# Patient Record
Sex: Female | Born: 1980 | Race: White | Hispanic: No | Marital: Single | Smoking: Current every day smoker
Health system: Southern US, Community
[De-identification: ages and names within clinical notes are randomized; demographics above are authoritative.]

## PROBLEM LIST (undated history)

## (undated) ENCOUNTER — Inpatient Hospital Stay (HOSPITAL_COMMUNITY): Payer: Self-pay

## (undated) DIAGNOSIS — F329 Major depressive disorder, single episode, unspecified: Secondary | ICD-10-CM

## (undated) DIAGNOSIS — F32A Depression, unspecified: Secondary | ICD-10-CM

## (undated) DIAGNOSIS — Z789 Other specified health status: Secondary | ICD-10-CM

## (undated) HISTORY — PX: SALPINGECTOMY: SHX328

---
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
## 2001-07-09 ENCOUNTER — Emergency Department (HOSPITAL_COMMUNITY): Admission: EM | Admit: 2001-07-09 | Discharge: 2001-07-09 | Payer: Self-pay | Admitting: Emergency Medicine

## 2001-07-11 ENCOUNTER — Emergency Department (HOSPITAL_COMMUNITY): Admission: EM | Admit: 2001-07-11 | Discharge: 2001-07-11 | Payer: Self-pay | Admitting: Emergency Medicine

## 2001-08-03 ENCOUNTER — Emergency Department (HOSPITAL_COMMUNITY): Admission: EM | Admit: 2001-08-03 | Discharge: 2001-08-04 | Payer: Self-pay | Admitting: Emergency Medicine

## 2001-08-04 ENCOUNTER — Encounter: Payer: Self-pay | Admitting: Emergency Medicine

## 2001-08-19 ENCOUNTER — Inpatient Hospital Stay (HOSPITAL_COMMUNITY): Admission: AD | Admit: 2001-08-19 | Discharge: 2001-08-19 | Payer: Self-pay | Admitting: Obstetrics

## 2001-12-06 ENCOUNTER — Inpatient Hospital Stay (HOSPITAL_COMMUNITY): Admission: AD | Admit: 2001-12-06 | Discharge: 2001-12-06 | Payer: Self-pay | Admitting: *Deleted

## 2001-12-07 ENCOUNTER — Emergency Department (HOSPITAL_COMMUNITY): Admission: EM | Admit: 2001-12-07 | Discharge: 2001-12-07 | Payer: Self-pay | Admitting: Emergency Medicine

## 2002-02-11 ENCOUNTER — Ambulatory Visit (HOSPITAL_COMMUNITY): Admission: RE | Admit: 2002-02-11 | Discharge: 2002-02-11 | Payer: Self-pay | Admitting: *Deleted

## 2002-03-13 ENCOUNTER — Inpatient Hospital Stay (HOSPITAL_COMMUNITY): Admission: AD | Admit: 2002-03-13 | Discharge: 2002-03-13 | Payer: Self-pay | Admitting: *Deleted

## 2002-04-12 ENCOUNTER — Encounter (INDEPENDENT_AMBULATORY_CARE_PROVIDER_SITE_OTHER): Payer: Self-pay | Admitting: Specialist

## 2002-04-12 ENCOUNTER — Inpatient Hospital Stay (HOSPITAL_COMMUNITY): Admission: AD | Admit: 2002-04-12 | Discharge: 2002-04-16 | Payer: Self-pay | Admitting: Obstetrics and Gynecology

## 2003-11-15 ENCOUNTER — Emergency Department (HOSPITAL_COMMUNITY): Admission: EM | Admit: 2003-11-15 | Discharge: 2003-11-16 | Payer: Self-pay | Admitting: Emergency Medicine

## 2004-03-19 ENCOUNTER — Emergency Department (HOSPITAL_COMMUNITY): Admission: EM | Admit: 2004-03-19 | Discharge: 2004-03-19 | Payer: Self-pay | Admitting: Emergency Medicine

## 2004-05-25 ENCOUNTER — Emergency Department (HOSPITAL_COMMUNITY): Admission: EM | Admit: 2004-05-25 | Discharge: 2004-05-25 | Payer: Self-pay | Admitting: Emergency Medicine

## 2004-05-28 ENCOUNTER — Emergency Department (HOSPITAL_COMMUNITY): Admission: EM | Admit: 2004-05-28 | Discharge: 2004-05-28 | Payer: Self-pay | Admitting: Emergency Medicine

## 2004-06-12 ENCOUNTER — Emergency Department (HOSPITAL_COMMUNITY): Admission: EM | Admit: 2004-06-12 | Discharge: 2004-06-12 | Payer: Self-pay | Admitting: Emergency Medicine

## 2004-06-14 ENCOUNTER — Inpatient Hospital Stay (HOSPITAL_COMMUNITY): Admission: AD | Admit: 2004-06-14 | Discharge: 2004-06-14 | Payer: Self-pay | Admitting: Obstetrics and Gynecology

## 2004-07-20 ENCOUNTER — Emergency Department (HOSPITAL_COMMUNITY): Admission: EM | Admit: 2004-07-20 | Discharge: 2004-07-20 | Payer: Self-pay | Admitting: Emergency Medicine

## 2004-08-15 ENCOUNTER — Emergency Department (HOSPITAL_COMMUNITY): Admission: EM | Admit: 2004-08-15 | Discharge: 2004-08-15 | Payer: Self-pay | Admitting: Emergency Medicine

## 2004-12-28 ENCOUNTER — Emergency Department (HOSPITAL_COMMUNITY): Admission: EM | Admit: 2004-12-28 | Discharge: 2004-12-28 | Payer: Self-pay | Admitting: Emergency Medicine

## 2005-01-19 ENCOUNTER — Emergency Department (HOSPITAL_COMMUNITY): Admission: EM | Admit: 2005-01-19 | Discharge: 2005-01-19 | Payer: Self-pay | Admitting: Emergency Medicine

## 2005-01-24 ENCOUNTER — Emergency Department (HOSPITAL_COMMUNITY): Admission: EM | Admit: 2005-01-24 | Discharge: 2005-01-24 | Payer: Self-pay | Admitting: Emergency Medicine

## 2005-07-17 ENCOUNTER — Emergency Department (HOSPITAL_COMMUNITY): Admission: EM | Admit: 2005-07-17 | Discharge: 2005-07-17 | Payer: Self-pay | Admitting: *Deleted

## 2005-07-28 ENCOUNTER — Emergency Department (HOSPITAL_COMMUNITY): Admission: EM | Admit: 2005-07-28 | Discharge: 2005-07-29 | Payer: Self-pay | Admitting: Emergency Medicine

## 2005-08-05 ENCOUNTER — Emergency Department (HOSPITAL_COMMUNITY): Admission: EM | Admit: 2005-08-05 | Discharge: 2005-08-05 | Payer: Self-pay | Admitting: Emergency Medicine

## 2005-09-24 ENCOUNTER — Inpatient Hospital Stay (HOSPITAL_COMMUNITY): Admission: AD | Admit: 2005-09-24 | Discharge: 2005-09-26 | Payer: Self-pay | Admitting: Family Medicine

## 2005-09-25 ENCOUNTER — Encounter (INDEPENDENT_AMBULATORY_CARE_PROVIDER_SITE_OTHER): Payer: Self-pay | Admitting: Specialist

## 2005-10-29 HISTORY — PX: SALPINGECTOMY: SHX328

## 2006-01-27 ENCOUNTER — Emergency Department (HOSPITAL_COMMUNITY): Admission: EM | Admit: 2006-01-27 | Discharge: 2006-01-27 | Payer: Self-pay | Admitting: Emergency Medicine

## 2006-01-28 ENCOUNTER — Inpatient Hospital Stay (HOSPITAL_COMMUNITY): Admission: AD | Admit: 2006-01-28 | Discharge: 2006-01-28 | Payer: Self-pay | Admitting: *Deleted

## 2006-03-11 ENCOUNTER — Emergency Department (HOSPITAL_COMMUNITY): Admission: EM | Admit: 2006-03-11 | Discharge: 2006-03-11 | Payer: Self-pay | Admitting: Emergency Medicine

## 2006-03-24 ENCOUNTER — Emergency Department (HOSPITAL_COMMUNITY): Admission: EM | Admit: 2006-03-24 | Discharge: 2006-03-24 | Payer: Self-pay | Admitting: Emergency Medicine

## 2006-06-14 ENCOUNTER — Emergency Department (HOSPITAL_COMMUNITY): Admission: EM | Admit: 2006-06-14 | Discharge: 2006-06-14 | Payer: Self-pay | Admitting: Emergency Medicine

## 2006-07-25 ENCOUNTER — Emergency Department (HOSPITAL_COMMUNITY): Admission: EM | Admit: 2006-07-25 | Discharge: 2006-07-26 | Payer: Self-pay | Admitting: *Deleted

## 2006-08-05 ENCOUNTER — Emergency Department (HOSPITAL_COMMUNITY): Admission: EM | Admit: 2006-08-05 | Discharge: 2006-08-05 | Payer: Self-pay | Admitting: Emergency Medicine

## 2006-10-17 ENCOUNTER — Emergency Department (HOSPITAL_COMMUNITY): Admission: EM | Admit: 2006-10-17 | Discharge: 2006-10-17 | Payer: Self-pay | Admitting: Emergency Medicine

## 2006-10-17 ENCOUNTER — Inpatient Hospital Stay (HOSPITAL_COMMUNITY): Admission: AD | Admit: 2006-10-17 | Discharge: 2006-10-17 | Payer: Self-pay | Admitting: Gynecology

## 2006-10-19 ENCOUNTER — Inpatient Hospital Stay (HOSPITAL_COMMUNITY): Admission: AD | Admit: 2006-10-19 | Discharge: 2006-10-19 | Payer: Self-pay | Admitting: Obstetrics & Gynecology

## 2006-10-21 ENCOUNTER — Inpatient Hospital Stay (HOSPITAL_COMMUNITY): Admission: AD | Admit: 2006-10-21 | Discharge: 2006-10-21 | Payer: Self-pay | Admitting: Obstetrics and Gynecology

## 2006-10-25 ENCOUNTER — Inpatient Hospital Stay (HOSPITAL_COMMUNITY): Admission: AD | Admit: 2006-10-25 | Discharge: 2006-10-25 | Payer: Self-pay | Admitting: Obstetrics & Gynecology

## 2006-11-08 ENCOUNTER — Inpatient Hospital Stay (HOSPITAL_COMMUNITY): Admission: RE | Admit: 2006-11-08 | Discharge: 2006-11-08 | Payer: Self-pay | Admitting: Obstetrics & Gynecology

## 2006-11-13 ENCOUNTER — Ambulatory Visit: Payer: Self-pay | Admitting: Obstetrics & Gynecology

## 2006-11-13 ENCOUNTER — Ambulatory Visit (HOSPITAL_COMMUNITY): Admission: AD | Admit: 2006-11-13 | Discharge: 2006-11-13 | Payer: Self-pay | Admitting: Obstetrics & Gynecology

## 2006-11-13 ENCOUNTER — Encounter (INDEPENDENT_AMBULATORY_CARE_PROVIDER_SITE_OTHER): Payer: Self-pay | Admitting: *Deleted

## 2007-03-02 ENCOUNTER — Inpatient Hospital Stay (HOSPITAL_COMMUNITY): Admission: AD | Admit: 2007-03-02 | Discharge: 2007-03-02 | Payer: Self-pay | Admitting: Obstetrics & Gynecology

## 2007-06-03 ENCOUNTER — Inpatient Hospital Stay (HOSPITAL_COMMUNITY): Admission: AD | Admit: 2007-06-03 | Discharge: 2007-06-04 | Payer: Self-pay | Admitting: Obstetrics and Gynecology

## 2007-06-16 ENCOUNTER — Inpatient Hospital Stay (HOSPITAL_COMMUNITY): Admission: AD | Admit: 2007-06-16 | Discharge: 2007-06-17 | Payer: Self-pay | Admitting: Obstetrics & Gynecology

## 2007-06-23 ENCOUNTER — Inpatient Hospital Stay (HOSPITAL_COMMUNITY): Admission: AD | Admit: 2007-06-23 | Discharge: 2007-06-23 | Payer: Self-pay | Admitting: Obstetrics & Gynecology

## 2007-07-14 ENCOUNTER — Emergency Department (HOSPITAL_COMMUNITY): Admission: EM | Admit: 2007-07-14 | Discharge: 2007-07-14 | Payer: Self-pay | Admitting: Emergency Medicine

## 2007-08-24 ENCOUNTER — Inpatient Hospital Stay (HOSPITAL_COMMUNITY): Admission: AD | Admit: 2007-08-24 | Discharge: 2007-08-24 | Payer: Self-pay | Admitting: Obstetrics & Gynecology

## 2007-08-25 ENCOUNTER — Inpatient Hospital Stay (HOSPITAL_COMMUNITY): Admission: AD | Admit: 2007-08-25 | Discharge: 2007-08-25 | Payer: Self-pay | Admitting: Obstetrics & Gynecology

## 2009-08-12 ENCOUNTER — Ambulatory Visit (HOSPITAL_COMMUNITY): Admission: RE | Admit: 2009-08-12 | Discharge: 2009-08-12 | Payer: Self-pay | Admitting: Obstetrics & Gynecology

## 2009-10-19 ENCOUNTER — Ambulatory Visit (HOSPITAL_COMMUNITY): Admission: RE | Admit: 2009-10-19 | Discharge: 2009-10-19 | Payer: Self-pay | Admitting: Family Medicine

## 2009-12-05 ENCOUNTER — Inpatient Hospital Stay (HOSPITAL_COMMUNITY): Admission: AD | Admit: 2009-12-05 | Discharge: 2009-12-05 | Payer: Self-pay | Admitting: Obstetrics & Gynecology

## 2009-12-16 ENCOUNTER — Inpatient Hospital Stay (HOSPITAL_COMMUNITY): Admission: AD | Admit: 2009-12-16 | Discharge: 2009-12-16 | Payer: Self-pay | Admitting: Obstetrics & Gynecology

## 2009-12-25 ENCOUNTER — Inpatient Hospital Stay (HOSPITAL_COMMUNITY): Admission: AD | Admit: 2009-12-25 | Discharge: 2009-12-25 | Payer: Self-pay | Admitting: Obstetrics and Gynecology

## 2010-01-01 ENCOUNTER — Ambulatory Visit: Payer: Self-pay | Admitting: Obstetrics & Gynecology

## 2010-01-01 ENCOUNTER — Encounter: Payer: Self-pay | Admitting: Obstetrics & Gynecology

## 2010-01-01 ENCOUNTER — Inpatient Hospital Stay (HOSPITAL_COMMUNITY): Admission: AD | Admit: 2010-01-01 | Discharge: 2010-01-04 | Payer: Self-pay | Admitting: Obstetrics & Gynecology

## 2010-01-09 ENCOUNTER — Ambulatory Visit: Payer: Self-pay | Admitting: Obstetrics & Gynecology

## 2010-01-16 ENCOUNTER — Ambulatory Visit: Payer: Self-pay | Admitting: Obstetrics and Gynecology

## 2010-01-20 ENCOUNTER — Inpatient Hospital Stay (HOSPITAL_COMMUNITY): Admission: AD | Admit: 2010-01-20 | Discharge: 2010-01-20 | Payer: Self-pay | Admitting: Obstetrics and Gynecology

## 2010-01-25 ENCOUNTER — Ambulatory Visit: Payer: Self-pay | Admitting: Obstetrics and Gynecology

## 2010-01-30 ENCOUNTER — Ambulatory Visit: Payer: Self-pay | Admitting: Family

## 2010-01-30 ENCOUNTER — Inpatient Hospital Stay (HOSPITAL_COMMUNITY): Admission: RE | Admit: 2010-01-30 | Discharge: 2010-01-30 | Payer: Self-pay | Admitting: Obstetrics & Gynecology

## 2010-02-05 ENCOUNTER — Emergency Department (HOSPITAL_COMMUNITY): Admission: EM | Admit: 2010-02-05 | Discharge: 2010-02-05 | Payer: Self-pay | Admitting: Emergency Medicine

## 2010-02-08 ENCOUNTER — Emergency Department (HOSPITAL_COMMUNITY): Admission: EM | Admit: 2010-02-08 | Discharge: 2010-02-08 | Payer: Self-pay | Admitting: Emergency Medicine

## 2010-02-11 ENCOUNTER — Emergency Department (HOSPITAL_COMMUNITY): Admission: EM | Admit: 2010-02-11 | Discharge: 2010-02-11 | Payer: Self-pay | Admitting: Family Medicine

## 2010-02-19 ENCOUNTER — Inpatient Hospital Stay (HOSPITAL_COMMUNITY): Admission: AD | Admit: 2010-02-19 | Discharge: 2010-02-19 | Payer: Self-pay | Admitting: Obstetrics & Gynecology

## 2010-05-07 ENCOUNTER — Emergency Department (HOSPITAL_COMMUNITY): Admission: EM | Admit: 2010-05-07 | Discharge: 2010-05-07 | Payer: Self-pay | Admitting: Emergency Medicine

## 2011-01-14 LAB — BASIC METABOLIC PANEL
BUN: 24 mg/dL — ABNORMAL HIGH (ref 6–23)
Chloride: 106 mEq/L (ref 96–112)
GFR calc Af Amer: >60 mL/min (ref >60)
GFR calc non Af Amer: >60 mL/min (ref >60)
Glucose, Bld: 98 mg/dL (ref 70–99)
Potassium: 3.9 mEq/L (ref 3.5–5.1)

## 2011-01-14 LAB — URINALYSIS, ROUTINE W REFLEX MICROSCOPIC
Glucose, UA: NEGATIVE mg/dL
Ketones, ur: NEGATIVE mg/dL
Nitrite: NEGATIVE
Protein, ur: NEGATIVE mg/dL
Specific Gravity, Urine: 1.023 (ref 1.005–1.030)
Urobilinogen, UA: 0.2 mg/dL (ref 0.0–1.0)
pH: 5.5 (ref 5.0–8.0)

## 2011-01-14 LAB — DIFFERENTIAL
Basophils Absolute: 0.1 10*3/uL (ref 0.0–0.1)
Basophils Relative: 1 % (ref 0–1)
Lymphocytes Relative: 30 % (ref 12–46)
Monocytes Absolute: 0.7 10*3/uL (ref 0.1–1.0)
Neutro Abs: 6.2 10*3/uL (ref 1.7–7.7)

## 2011-01-14 LAB — CBC
MCV: 89.4 fL (ref 78.0–100.0)
Platelets: 216 10*3/uL (ref 150–400)
RDW: 12.6 % (ref 11.5–15.5)

## 2011-01-14 LAB — POCT PREGNANCY, URINE: Preg Test, Ur: NEGATIVE

## 2011-01-14 LAB — URINE MICROSCOPIC-ADD ON

## 2011-01-16 LAB — RAPID URINE DRUG SCREEN, HOSP PERFORMED: Cocaine: NOT DETECTED

## 2011-01-21 LAB — RAPID URINE DRUG SCREEN, HOSP PERFORMED
Barbiturates: POSITIVE — AB
Benzodiazepines: NOT DETECTED
Cocaine: NOT DETECTED
Opiates: NOT DETECTED
Tetrahydrocannabinol: NOT DETECTED

## 2011-01-21 LAB — CROSSMATCH
ABO/RH(D): A POS
Antibody Screen: NEGATIVE

## 2011-01-21 LAB — CBC
Hemoglobin: 11.9 g/dL — ABNORMAL LOW (ref 12.0–15.0)
MCV: 91.9 fL (ref 78.0–100.0)
RDW: 15.4 % (ref 11.5–15.5)
WBC: 15 10*3/uL — ABNORMAL HIGH (ref 4.0–10.5)

## 2011-03-16 NOTE — Discharge Summary (Signed)
[name], [name]            ACCOUNT NO.:  [id number]   MEDICAL RECORD NO.:  [id number]          PATIENT TYPE:  INP   LOCATION:  9317                          [facility]   PHYSICIAN:  Osborn Coho, M.D.   DATE OF BIRTH:  Nov 15, 1980   DATE OF ADMISSION:  09/24/2005  DATE OF DISCHARGE:  09/26/2005                                 DISCHARGE SUMMARY   DISCHARGE DIAGNOSIS:  Left ectopic pregnancy.   OPERATIONS:  On hospital day #1, the patient underwent a laparoscopic left  salpingectomy, and tolerated the procedure well. The patient was found to  have a leaking left ectopic which was removed, as described.   HISTORY OF PRESENT ILLNESS:  [name] is a 30 year old female who  presented to [facility] maternity admissions unit  complaining of left lower quadrant pain of several hours' duration. She was  found to have a positive urine pregnancy test with a beta HCG of 620 and a  pelvic ultrasound which revealed a 4 x 3 cm left mass with moderate to large  free fluid consistent with a ruptured ectopic. The patient's blood type was  A positive. Please see the patient's hospital chart for details.   ADMISSION PHYSICAL EXAMINATION:  VITAL SIGNS:  Blood pressure 114/64, pulse  is 107, respirations 20, temperature 98.5 degrees Fahrenheit orally.  GENERAL EXAM:  Within normal limits.  PELVIC EXAM:  External genitalia within normal limits. Vagina revealed  menstrual blood. Cervix was closed. Uterus was upper limits of normal size.  Adnexa was tender, left greater than right.   HOSPITAL COURSE:  On hospital day #1, the patient underwent a left  salpingectomy for a ruptured ectopic pregnancy, tolerating procedure well..  The patient's postoperative course was unremarkable with the patient having  received the maximum benefit of her hospital stay by postoperative day #1,  and, therefore, deemed ready for discharge home. The patient's discharge  hemoglobin was 11.6  (admission hemoglobin 13.4).   DISCHARGE MEDICATIONS:  Percocet 1-2 tablets every 4 hours as needed for  pain.   FOLLOW UP:  The patient is to follow up with Dr. Su Hilt on October 11, 2005 at 2:15 p.m.   DISCHARGE INSTRUCTIONS:  The patient was advised to avoid driving as long as  she is taking Percocet during the day.  She was further told that she may  shower, and may walk up steps. She should increase her activity slowly and  avoid sexual intercourse for 2 weeks. The patient's diet is without  restriction.   FINAL PATHOLOGY:  Left fallopian tube and ectopic:  Fallopian tube with  luminal chorionic villae consistent with tubal ectopic pregnancy.      [name].      Osborn Coho, M.D.  Electronically Signed    EJP/MEDQ  D:  10/13/2005  T:  10/14/2005  Job:  [id number]

## 2011-03-16 NOTE — Op Note (Signed)
NAMEDAISHIA, Kathryn Goodwin            ACCOUNT NO.:  [id number]   MEDICAL RECORD NO.:  [id number]          PATIENT TYPE:  INP   LOCATION:  9317                          [facility]   PHYSICIAN:  Osborn Coho, M.D.   DATE OF BIRTH:  [birth year]   DATE OF PROCEDURE:  09/25/2005  DATE OF DISCHARGE:                                 OPERATIVE REPORT   PREOPERATIVE DIAGNOSIS:  Leaking left ectopic pregnancy.   POSTOPERATIVE DIAGNOSIS:  Leaking left ectopic pregnancy.   PROCEDURE:  Laparoscopic left salpingectomy.   ANESTHESIA:  General.   ATTENDING:  Osborn Coho, M.D.   FLUIDS:  1500 mL.   URINE OUTPUT:  150 mL.   ESTIMATED BLOOD LOSS:  Minimal, approximately 100 mL of clotted blood in  abdomen.   COMPLICATIONS:  None.   FINDINGS:  Left ectopic with leakage.   PROCEDURE:  The patient is taken to the operating room after risks,  benefits, alternatives reviewed with the patient. The patient verbalized  understanding and consent signed and witnessed. The patient was placed under  general anesthesia and prepped and draped in the dorsolithotomy position. A  10 mm incision was made at the umbilicus after applying half percent  Marcaine. A 10 mm incision was made and Veress needle passed into the intra-  abdominal cavity and pneumoperitoneum achieved. A 10 mm trocar was advanced  into the intra-abdominal cavity without difficulty and laparoscope  introduced. Normal bilateral ovaries in appearance. Normal-appearing right  fallopian tube. Left fallopian tube with ectopic. In Trendelenburg, a left  lower quadrant 5 mm incision was made after applying half percent Marcaine  and the same was done in the midline at two fingerbreadths breadths above  the symphysis pubis. 5 mm trocars were advanced into the left lower quadrant  and suprapubic incision under direct visualization. Instruments were placed  and ectopic ligated with 2-0 Vicryl Endoloops. The pedicle was cauterized  with the  Kleppingers and excised with the Endo shears. The ectopic was  removed after being placed in an Endopouch through the 10 mm port while  using a 5 mm scope. The 5 mm scope had been placed in the midline suprapubic  port. After removal of the ectopic in the Endopouch, the 10 mm scope was  replaced into the 10 mm incision. Clot was removed and copious irrigation  performed. There was hemostasis noted of the pedicle. The patient was taken  out of Trendelenburg and two 5 mm trocars removed under direct  visualization. The pneumoperitoneum was relieved and the 10 mm trocar was  removed under direct visualization. The fascia was  repaired with 0 Vicryl in a running fashion and all skin incisions were  reapproximated using 3-0 Monocryl via a subcuticular stitch. Sponge, lap and  needle count was correct. The patient tolerated procedure well and was  returned to the recovery room in good condition.      Osborn Coho, M.D.  Electronically Signed     AR/MEDQ  D:  09/25/2005  T:  09/25/2005  Job:  [id number]

## 2011-03-16 NOTE — Consult Note (Signed)
Kathryn Goodwin, Kathryn Goodwin NO.:  [id number]   MEDICAL RECORD NO.:  [id number]          PATIENT TYPE:  MAT   LOCATION:  MATC                          [facility]  [facility]   PHYSICIAN:  Janine Limbo, M.D.DATE OF BIRTH:  1981-07-16   DATE OF CONSULTATION:  09/24/2005  DATE OF DISCHARGE:                                   CONSULTATION   HISTORY OF PRESENT ILLNESS:  Kathryn Goodwin is a 30 year old female, gravida  3, para 1-0-1-1, who presented to the emergency department at the [facility] complaining of left lower quadrant pain and vaginal  spotting. The patient's last menstrual period was 07/09/2005 and that makes  her approximately [redacted] weeks pregnant. She was not using contraception and she  reports that she did not care one way or another if she got pregnant. The  patient has a past history of a cesarean delivery at term. She reports that  her last Pap smear was 1 year ago.   OBSTETRICAL HISTORY:  The patient has had one cesarean section at term and  one first trimester spontaneous miscarriage.   DRUG ALLERGIES:  No known drug allergies.   PAST MEDICAL HISTORY:  The patient denies hypertension and diabetes.   SOCIAL HISTORY:  The patient says she smokes approximately 3 to 4 cigarettes  a day at this point and she is trying to cut down since she found out that  she was pregnant. She denies alcohol use. The patient says that she has used  marijuana in the past but has stopped using marijuana since she found out  she was pregnant.   REVIEW OF SYSTEMS:  Noncontributory.   FAMILY HISTORY:  Is noncontributory.   PHYSICAL EXAM:  VITAL SIGNS:  Temperature is 95, pulse 107, respirations 20,  blood pressure 114/64.  HEENT:  Within normal limits except for the patient that is appropriately  upset about her ectopic pregnancy.  CHEST:  Clear.  HEART:  Regular rate and rhythm.  ABDOMEN:  Her abdomen is soft and nontender.  EXTREMITIES:  Extremities are  grossly normal.  NEUROLOGY:  Neurologic exam is grossly normal.  PELVIC:  External genitalia is normal. Vagina is normal except for small  amount of menstrual blood. The cervix is nontender in closed. The uterus is  upper limits normal size and nontender. Adnexa are tender bilaterally but  the left side is more tender than the right side. No masses are appreciated.   LABORATORY VALUES:  Urine pregnancy test is positive. Quantitative beta HCG  is 620, blood type is A+. Ultrasound shows a 4 x 3 cm left adnexal mass  consistent with an ectopic pregnancy. There is a moderate to large amount of  free fluid present in the posterior cul-de-sac. The uterus is normal size.  Hemoglobin is 13.4, hematocrit is 39.3. White blood cell count is 11,500,  platelet count is 326,000.   ASSESSMENT:  Probable left ectopic pregnancy with possible rupture.   PLAN:  A long discussion was held with the patient about her options. I have  recommended that she proceed with laparoscopy with left salpingectomy. The  risks  and benefits of her surgical procedure were reviewed and the risks  include, but are not limited to, anesthetic complications, bleeding,  infections, and possible damage to surrounding organs.   I am the on call physician for the GYN service for [facility] and I  have been called to see this patient. She is not a patient in our office.      Janine Limbo, M.D.  Electronically Signed     AVS/MEDQ  D:  09/25/2005  T:  09/25/2005  Job:  [id number]

## 2011-03-16 NOTE — Op Note (Signed)
[facility]  Patient:    Kathryn Goodwin, Kathryn Goodwin Visit Number: [id number] MRN: [id number]          Service Type: OBS Location: 910A 9139 01 Attending Physician:  Amada Kingfisher. Dictated by:   Kristen Loader, M.D. Proc. Date: 04/13/02 Admit Date:  04/12/2002                             Operative Report  PREOPERATIVE DIAGNOSIS:       Nonreassuring fetal heart tracing.  POSTOPERATIVE DIAGNOSIS:      Nonreassuring fetal heart tracing.  OPERATION:                    Low flap cesarean section.  SURGEON:                      Kristen Loader, M.D.  DESCRIPTION OF PROCEDURE:     Under satisfactory epidural anesthesia with the patient in the dorsosupine position, a Foley catheter in the urinary bladder, the abdomen was prepped and draped in the usual sterile manner.  Entered through a Pfannenstiel incision situated 3 cm above the symphysis pubis and extended for a total length of 16 cm.  The abdomen was entered by layers and entered the perineal cavity.  The viscera peritoneum and anterior surface of the uterus were opened transversely by sharp dissection, and the posterior uterine segment was entered by sharp and blunt dissection.  From an LOT presentation, the babys vertex was easily delivered, cord doubly divided, clamped, the baby handed to the pediatrician.  It was a female infant weighing 7 pounds 15 ounces with 9/9 Apgars.  Placenta was manually removed, the uterus explored, and the uterus closed with continuous running locked 0 Vicryl in an atraumatic needle in two layers.  The area was observed for bleeding.  None was noted, and the fascia was closed with a continuous running alternating locked 0 Vicryl and atraumatic needle.  Incision meeting in the mid point, and skin edges were approximated with skin staples. Subcutaneous bleeders were coagulated with hot cautery, and dry sterile dressing was applied.  Total blood loss was 600 cc.  The patient  tolerated the procedure well and was transferred to the recovery room in satisfactory condition. Dictated by:   Kristen Loader, M.D. Attending Physician:  Amada Kingfisher. DD:  04/13/02 TD:  04/14/02 Job: [id number]
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
## 2011-03-16 NOTE — Consult Note (Signed)
NAMELAUREL, Kathryn Goodwin            ACCOUNT NO.:  [id number]   MEDICAL RECORD NO.:  [id number]          PATIENT TYPE:  INP   LOCATION:  9317                          [facility]   PHYSICIAN:  Janine Limbo, M.D.DATE OF BIRTH:  04-03-1981   DATE OF CONSULTATION:  09/25/2005  DATE OF DISCHARGE:                                   CONSULTATION   HISTORY OF PRESENT ILLNESS:  [name] is a 30 year old female gravida 3,  para 1-0-1-1 with what appears to be a left ectopic pregnancy that is likely  to be ruptured. I have had an extensive discussion with [name] in the  maternity admissions area. She refuses surgery at this point. We did review  the risk of continued bleeding, possible loss of future fertility, and even  possible death as a result of not having her ectopic treated. The patient  reports that she is simply not ready to have this surgery done at this time,  so she refuses any surgical intervention. The patient initially wanted to be  discharged to home, but after discussion with her nurse, the patient agrees  to at least remain here in the hospital for observation until she makes a  final decision about how she wants to proceed. She is currently waiting for  the father of her baby who is having transportation problems. We are  uncertain when or if he will be able to come to the hospital.      Janine Limbo, M.D.  Electronically Signed     AVS/MEDQ  D:  09/25/2005  T:  09/25/2005  Job:  [id number]

## 2011-03-16 NOTE — Op Note (Signed)
NAMEMELAT, WRISLEY            ACCOUNT NO.:  [id number]   MEDICAL RECORD NO.:  [id number]          PATIENT TYPE:  AMB   LOCATION:  MATC                          [facility]   PHYSICIAN:  Phil D. Okey Dupre, M.D.     DATE OF BIRTH:  11-20-1980   DATE OF PROCEDURE:  11/13/2006  DATE OF DISCHARGE:                               OPERATIVE REPORT   PROCEDURE:  Dilatation and evacuation.   PREOPERATIVE DIAGNOSIS:  Missed abortion.   POSTOPERATIVE DIAGNOSIS:  Missed abortion.   SURGEON:  Javier Glazier. Okey Dupre, M.D.   ANESTHESIA:  MAC plus local.   ESTIMATED BLOOD LOSS:  Minimal.   POSTOPERATIVE CONDITION:  Satisfactory.   SPECIMENS:  Products of conception to pathology.   DESCRIPTION OF PROCEDURE:  Under satisfactory MAC analgesia, with the  patient in the dorsal lithotomy position, the perineum and vagina were  prepped and draped in the usual sterile manner.  Bimanual pelvic  examination under anesthesia revealed uterus about [redacted] weeks gestational  size, anterior, flexed and freely movable.  A weighted speculum was  placed in the posterior fourchette of the vagina after the anterior lip  of the cervix was grasped with a single tooth tenaculum. 10 mL of 1%  Xylocaine was injected into each side of the paracervical area for  patient comfort at 4 and 8 o'clock. The uterine cavity was then sounded  to a depth of 10 cm.  The cervical os was dilated to #8 Hegar dilator  and the uterine contents evacuated easily with suction curet.  The  tenaculum was removed.  The patient was sent to the recovery room in  satisfactory condition.   ADDENDUM:  The patient, who came in with severe abdominal pain for which  no etiology could be found with vomiting, in discussing with her, she  has had a history of this on multiple occasions. CAT scan and ultrasound  were unrevealing. The patient also seemed extremely depressed.  I think  that she probably needs to be on an antidepressant and when we do see  her in  two weeks in follow-up, we will discuss that with the patient.  She may need some evaluation by gastroenterologist, also.           ______________________________  Javier Glazier. Okey Dupre, M.D.     PDR/MEDQ  D:  11/13/2006  T:  11/13/2006  Job:  [id number]

## 2011-04-23 ENCOUNTER — Emergency Department (HOSPITAL_COMMUNITY)
Admission: EM | Admit: 2011-04-23 | Discharge: 2011-04-24 | Disposition: A | Discharge status: Home / Self Care | Payer: Self-pay | Attending: Emergency Medicine | Admitting: Emergency Medicine

## 2011-04-23 DIAGNOSIS — N949 Unspecified condition associated with female genital organs and menstrual cycle: Secondary | ICD-10-CM | POA: Insufficient documentation

## 2011-04-23 DIAGNOSIS — O039 Complete or unspecified spontaneous abortion without complication: Secondary | ICD-10-CM | POA: Insufficient documentation

## 2011-04-23 LAB — CBC
Hemoglobin: 13.1 g/dL (ref 12.0–15.0)
MCH: 31.5 pg (ref 26.0–34.0)
MCV: 90.1 fL (ref 78.0–100.0)
Platelets: 252 10*3/uL (ref 150–400)

## 2011-04-23 LAB — URINALYSIS, ROUTINE W REFLEX MICROSCOPIC
Glucose, UA: NEGATIVE mg/dL
Ketones, ur: NEGATIVE mg/dL
Leukocytes, UA: NEGATIVE
Specific Gravity, Urine: 1.03 (ref 1.005–1.030)
pH: 6 (ref 5.0–8.0)

## 2011-04-23 LAB — DIFFERENTIAL
Eosinophils Absolute: 0.1 10*3/uL (ref 0.0–0.7)
Eosinophils Relative: 1 % (ref 0–5)
Lymphs Abs: 4.5 10*3/uL — ABNORMAL HIGH (ref 0.7–4.0)
Monocytes Absolute: 0.9 10*3/uL (ref 0.1–1.0)
Neutro Abs: 8.1 10*3/uL — ABNORMAL HIGH (ref 1.7–7.7)

## 2011-04-23 LAB — BASIC METABOLIC PANEL
CO2: 26 mEq/L (ref 19–32)
Calcium: 9.4 mg/dL (ref 8.4–10.5)
Creatinine, Ser: 0.66 mg/dL (ref 0.50–1.10)
GFR calc non Af Amer: >60 mL/min (ref >60)
Sodium: 142 mEq/L (ref 135–145)

## 2011-04-23 LAB — URINE MICROSCOPIC-ADD ON

## 2011-04-24 ENCOUNTER — Emergency Department (HOSPITAL_COMMUNITY): Payer: Self-pay

## 2011-04-24 LAB — WET PREP, GENITAL: Clue Cells Wet Prep HPF POC: NONE SEEN

## 2011-04-24 LAB — HCG, QUANTITATIVE, PREGNANCY: hCG, Beta Chain, Quant, S: 1168 m[IU]/mL — ABNORMAL HIGH (ref <5)

## 2011-04-24 LAB — GC/CHLAMYDIA PROBE AMP, GENITAL
Chlamydia, DNA Probe: NEGATIVE
GC Probe Amp, Genital: NEGATIVE

## 2011-04-24 LAB — GC/CHLAMYDIA PROBE AMP, URINE: Chlamydia, Swab/Urine, PCR: NEGATIVE

## 2011-04-26 ENCOUNTER — Ambulatory Visit: Payer: Self-pay | Admitting: Physician Assistant

## 2011-04-26 DIAGNOSIS — O039 Complete or unspecified spontaneous abortion without complication: Secondary | ICD-10-CM

## 2011-04-27 NOTE — Group Therapy Note (Signed)
Kathryn Goodwin, Kathryn Goodwin            ACCOUNT NO.:  [id number]  MEDICAL RECORD NO.:  [id number]           PATIENT TYPE:  A  LOCATION:  [facility]                   [facility]  PHYSICIAN:  Maylon Cos, CNM    DATE OF BIRTH:  1981-02-18  DATE OF SERVICE:                                 CLINIC NOTE  She presents today for followup on spontaneous abortion.  She was seen at [facility] ER and had an ultrasound, which did demonstrate an irregular, elongated intrauterine gestational sac extending into lower uterine segments consistent with ongoing spontaneous AB.  She states since that time her bleeding has gotten significantly less, although she continues to have cramping.  She has no other complaints at this time.  PHYSICAL EXAMINATION:  GENERAL:  She is pleasant, alert, and oriented x3, in no apparent distress. ABDOMEN:  Soft, slightly tender to palpation.  She does have a prior midline cesarean scar. GENITALIA:  She does have normal external female genitalia.  Bimanual examination reveals uterus to be top normal size and shape.  Slightly tender to palpation and no adnexal masses.  Beta quant at last visit in the ER was 1168.  ASSESSMENT AND PLAN:  Spontaneous abortion.  At this time, it does appear that she has probably completed her abortion.  She will return in 2 weeks for repeat beta quant or p.r.n.  We discussed with her appropriate diet, activities, __________ .  If she has any difficulties including worsening pain, heavy vaginal bleeding, she will let us know immediately.  She understood this and agreed.  Also, we will give her prescription for Lortab 5/500 to take 1 p.o. q.4-6 h. p.r.n. pain #20 with no refills. She understood this and agreed.  She had no other questions or concerns at this time.          ______________________________ Maylon Cos, CNM    SS/MEDQ  D:  04/26/2011  T:  04/27/2011  Job:  [id number]

## 2011-05-10 ENCOUNTER — Other Ambulatory Visit: Payer: Self-pay

## 2011-05-20 ENCOUNTER — Inpatient Hospital Stay (HOSPITAL_COMMUNITY)
Admission: AD | Admit: 2011-05-20 | Discharge: 2011-05-21 | Disposition: A | Discharge status: Home / Self Care | Payer: Self-pay | Source: Ambulatory Visit | Attending: Obstetrics and Gynecology | Admitting: Obstetrics and Gynecology

## 2011-05-20 DIAGNOSIS — K529 Noninfective gastroenteritis and colitis, unspecified: Secondary | ICD-10-CM

## 2011-05-20 DIAGNOSIS — R112 Nausea with vomiting, unspecified: Secondary | ICD-10-CM | POA: Insufficient documentation

## 2011-05-20 DIAGNOSIS — R1084 Generalized abdominal pain: Secondary | ICD-10-CM | POA: Insufficient documentation

## 2011-05-20 DIAGNOSIS — R197 Diarrhea, unspecified: Secondary | ICD-10-CM | POA: Insufficient documentation

## 2011-05-20 DIAGNOSIS — K5289 Other specified noninfective gastroenteritis and colitis: Secondary | ICD-10-CM | POA: Insufficient documentation

## 2011-05-20 HISTORY — DX: Other specified health status: Z78.9

## 2011-05-20 NOTE — Progress Notes (Signed)
Been hurting for a week, but last 5-6 hrs feels like a ctx, lower right side.  LMP (had miscarriage last month)  G5p3

## 2011-05-21 ENCOUNTER — Encounter (HOSPITAL_COMMUNITY): Payer: Self-pay | Admitting: *Deleted

## 2011-05-21 LAB — CBC
HCT: 45.2 % (ref 36.0–46.0)
MCH: 31.3 pg (ref 26.0–34.0)
MCHC: 34.1 g/dL (ref 30.0–36.0)
MCV: 91.9 fL (ref 78.0–100.0)
RDW: 13.7 % (ref 11.5–15.5)

## 2011-05-21 LAB — URINALYSIS, ROUTINE W REFLEX MICROSCOPIC
Bilirubin Urine: NEGATIVE
Hgb urine dipstick: NEGATIVE
Nitrite: NEGATIVE
Protein, ur: NEGATIVE mg/dL
Urobilinogen, UA: 0.2 mg/dL (ref 0.0–1.0)

## 2011-05-21 LAB — COMPREHENSIVE METABOLIC PANEL
Albumin: 4.6 g/dL (ref 3.5–5.2)
BUN: 24 mg/dL — ABNORMAL HIGH (ref 6–23)
Calcium: 9.8 mg/dL (ref 8.4–10.5)
Creatinine, Ser: 0.71 mg/dL (ref 0.50–1.10)
Total Bilirubin: 0.3 mg/dL (ref 0.3–1.2)
Total Protein: 8.6 g/dL — ABNORMAL HIGH (ref 6.0–8.3)

## 2011-05-21 MED ORDER — PROMETHAZINE HCL 12.5 MG PO TABS: 25.0000 mg | ORAL_TABLET | Freq: Four times a day (QID) | ORAL | Status: AC | PRN

## 2011-05-21 MED ORDER — DIPHENOXYLATE-ATROPINE 2.5-0.025 MG PO TABS
2.0000 | ORAL_TABLET | Freq: Once | ORAL | Status: AC
  Administered 2011-05-21: 2 via ORAL
  Filled 2011-05-21: qty 2

## 2011-05-21 MED ORDER — OXYCODONE-ACETAMINOPHEN 5-325 MG PO TABS
1.0000 | ORAL_TABLET | Freq: Once | ORAL | Status: AC
  Administered 2011-05-21: 1 via ORAL
  Filled 2011-05-21: qty 1

## 2011-05-21 MED ORDER — DIPHENOXYLATE-ATROPINE 2.5-0.025 MG PO TABS: 1.0000 | ORAL_TABLET | Freq: Four times a day (QID) | ORAL | Status: AC | PRN

## 2011-05-21 MED ORDER — ACETAMINOPHEN-CODEINE #3 300-30 MG PO TABS: 1.0000 | ORAL_TABLET | ORAL | Status: AC | PRN

## 2011-05-21 NOTE — ED Provider Notes (Signed)
History     Chief Complaint  Patient presents with  . Abdominal Pain   HPI Sharp low abd pain x 5-6 days, started on right side, now all over, + diarrhea x 5-6 days, +nausea, vomited last night, + hot flashes, no fever or chills. SAB last month, no period since, had f/u in gyn clinic and was told that everything was normal.   OB History    Grav Para Term Preterm Abortions TAB SAB Ect Mult Living   6 3   2  1 1  3       Past Medical History  Diagnosis Date  . No pertinent past medical history     Past Surgical History  Procedure Date  . Cesarean section   . Salpingectomy     left    No family history on file.  History  Substance Use Topics  . Smoking status: Current Everyday Smoker -- 0.2 packs/day  . Smokeless tobacco: Not on file  . Alcohol Use: Yes    Allergies: No Known Allergies  No prescriptions prior to admission    Review of Systems  Constitutional: Negative for fever and chills.  Respiratory: Negative.   Cardiovascular: Negative.   Gastrointestinal: Positive for nausea, vomiting, abdominal pain and diarrhea. Negative for constipation.  Genitourinary: Negative.   Musculoskeletal: Negative.   Neurological: Negative.   Psychiatric/Behavioral: Negative.    Physical Exam   Blood pressure 118/77, pulse 103, temperature 98.4 F (36.9 C), temperature source Oral, resp. rate 18, height 5\' 2"  (1.575 m), weight 68.947 kg (152 lb).  Physical Exam  Constitutional: She is oriented to person, place, and time. She appears well-developed and well-nourished. Distressed: uncomfortable appearing.  Cardiovascular: Normal rate.   Respiratory: Effort normal.  GI: Soft. Bowel sounds are normal. She exhibits no distension and no mass. There is tenderness (LLQ). There is no rebound and no guarding.  Musculoskeletal: Normal range of motion.  Neurological: She is alert and oriented to person, place, and time.  Skin: Skin is warm and dry.  Psychiatric: She has a normal mood  and affect.    MAU Course  Procedures  Results for orders placed during the hospital encounter of 05/20/11 (from the past 24 hour(s))  URINALYSIS, ROUTINE W REFLEX MICROSCOPIC     Status: Abnormal   Collection Time   05/21/11 12:15 AM      Component Value Range   Color, Urine YELLOW  YELLOW    Appearance CLEAR  CLEAR    Specific Gravity, Urine >1.030 (*) 1.005 - 1.030    pH 5.5  5.0 - 8.0    Glucose, UA NEGATIVE  NEGATIVE (mg/dL)   Hgb urine dipstick NEGATIVE  NEGATIVE    Bilirubin Urine NEGATIVE  NEGATIVE    Ketones, ur 15 (*) NEGATIVE (mg/dL)   Protein, ur NEGATIVE  NEGATIVE (mg/dL)   Urobilinogen, UA 0.2  0.0 - 1.0 (mg/dL)   Nitrite NEGATIVE  NEGATIVE    Leukocytes, UA NEGATIVE  NEGATIVE   POCT PREGNANCY, URINE     Status: Normal   Collection Time   05/21/11 12:20 AM      Component Value Range   Preg Test, Ur NEGATIVE    CBC     Status: Abnormal   Collection Time   05/21/11 12:54 AM      Component Value Range   WBC 12.7 (*) 4.0 - 10.5 (K/uL)   RBC 4.92  3.87 - 5.11 (MIL/uL)   Hemoglobin 15.4 (*) 12.0 - 15.0 (g/dL)   HCT  45.2  36.0 - 46.0 (%)   MCV 91.9  78.0 - 100.0 (fL)   MCH 31.3  26.0 - 34.0 (pg)   MCHC 34.1  30.0 - 36.0 (g/dL)   RDW 40.9  81.1 - 91.4 (%)   Platelets 224  150 - 400 (K/uL)  COMPREHENSIVE METABOLIC PANEL     Status: Abnormal   Collection Time   05/21/11 12:54 AM      Component Value Range   Sodium 134 (*) 135 - 145 (mEq/L)   Potassium 4.1  3.5 - 5.1 (mEq/L)   Chloride 100  96 - 112 (mEq/L)   CO2 24  19 - 32 (mEq/L)   Glucose, Bld 96  70 - 99 (mg/dL)   BUN 24 (*) 6 - 23 (mg/dL)   Creatinine, Ser 7.82  0.50 - 1.10 (mg/dL)   Calcium 9.8  8.4 - 95.6 (mg/dL)   Total Protein 8.6 (*) 6.0 - 8.3 (g/dL)   Albumin 4.6  3.5 - 5.2 (g/dL)   AST 14  0 - 37 (U/L)   ALT 11  0 - 35 (U/L)   Alkaline Phosphatase 82  39 - 117 (U/L)   Total Bilirubin 0.3  0.3 - 1.2 (mg/dL)   GFR calc non Af Amer >60  >60 (mL/min)   GFR calc Af Amer >60  >60 (mL/min)      Assessment and Plan  30 yo [id number] with likely viral gastroenteritis Rx Phenergan, Lomotil, Tylenol #3 Advance diet slowly, stay hydrated F/U if symptoms are not improved within 3-4 days   FRAZIER,NATALIE 05/21/2011, 12:27 AM

## 2011-05-21 NOTE — Progress Notes (Signed)
Pt reports "bad pains" on rt lower abd x 1 week, now just all over lower abd, nausea, tired, having hot flashes. Pressure with urintation.

## 2011-06-16 ENCOUNTER — Encounter (HOSPITAL_COMMUNITY): Payer: Self-pay | Admitting: *Deleted

## 2011-06-16 ENCOUNTER — Inpatient Hospital Stay (HOSPITAL_COMMUNITY)
Admission: AD | Admit: 2011-06-16 | Discharge: 2011-06-16 | Disposition: A | Discharge status: Home / Self Care | Payer: Self-pay | Source: Ambulatory Visit | Attending: Obstetrics and Gynecology | Admitting: Obstetrics and Gynecology

## 2011-06-16 ENCOUNTER — Inpatient Hospital Stay (HOSPITAL_COMMUNITY): Payer: Self-pay

## 2011-06-16 DIAGNOSIS — N926 Irregular menstruation, unspecified: Secondary | ICD-10-CM

## 2011-06-16 DIAGNOSIS — N939 Abnormal uterine and vaginal bleeding, unspecified: Secondary | ICD-10-CM

## 2011-06-16 DIAGNOSIS — N83209 Unspecified ovarian cyst, unspecified side: Secondary | ICD-10-CM | POA: Insufficient documentation

## 2011-06-16 DIAGNOSIS — N949 Unspecified condition associated with female genital organs and menstrual cycle: Secondary | ICD-10-CM | POA: Insufficient documentation

## 2011-06-16 DIAGNOSIS — N938 Other specified abnormal uterine and vaginal bleeding: Secondary | ICD-10-CM | POA: Insufficient documentation

## 2011-06-16 DIAGNOSIS — F329 Major depressive disorder, single episode, unspecified: Secondary | ICD-10-CM | POA: Insufficient documentation

## 2011-06-16 HISTORY — DX: Major depressive disorder, single episode, unspecified: F32.9

## 2011-06-16 HISTORY — DX: Depression, unspecified: F32.A

## 2011-06-16 LAB — URINALYSIS, ROUTINE W REFLEX MICROSCOPIC
Glucose, UA: NEGATIVE mg/dL
Leukocytes, UA: NEGATIVE
Protein, ur: NEGATIVE mg/dL
Specific Gravity, Urine: <1.005 — ABNORMAL LOW (ref 1.005–1.030)
pH: 5.5 (ref 5.0–8.0)

## 2011-06-16 LAB — CBC
HCT: 41.3 % (ref 36.0–46.0)
MCV: 92 fL (ref 78.0–100.0)
RDW: 13.6 % (ref 11.5–15.5)
WBC: 10 10*3/uL (ref 4.0–10.5)

## 2011-06-16 LAB — WET PREP, GENITAL
Trich, Wet Prep: NONE SEEN
Yeast Wet Prep HPF POC: NONE SEEN

## 2011-06-16 LAB — URINE MICROSCOPIC-ADD ON

## 2011-06-16 MED ORDER — BUTALBITAL-APAP-CAFFEINE 50-325-40 MG PO TABS
2.0000 | ORAL_TABLET | Freq: Once | ORAL | Status: AC
  Administered 2011-06-16: 2 via ORAL
  Filled 2011-06-16: qty 2

## 2011-06-16 MED ORDER — KETOROLAC TROMETHAMINE 60 MG/2ML IM SOLN
60.0000 mg | Freq: Once | INTRAMUSCULAR | Status: AC
  Administered 2011-06-16: 60 mg via INTRAMUSCULAR
  Filled 2011-06-16: qty 2

## 2011-06-16 MED ORDER — MEGESTROL ACETATE 40 MG PO TABS: 40.0000 mg | ORAL_TABLET | Freq: Every day | ORAL | Status: AC

## 2011-06-16 NOTE — ED Notes (Signed)
Easton Rice PA at bedside.  

## 2011-06-16 NOTE — ED Provider Notes (Signed)
History   Pt presents today c/o vag bleeding and abd pain that has been worsening over the past 2 wks. She denies vag irritation, fever, dysuria, or any other problems at this time.  Chief Complaint  Patient presents with  . Vaginal Bleeding  . Headache  . Back Pain  . Abdominal Pain   HPI  OB History    Grav Para Term Preterm Abortions TAB SAB Ect Mult Living   6 3   3  2 1  3       Past Medical History  Diagnosis Date  . No pertinent past medical history     Past Surgical History  Procedure Date  . Cesarean section   . Salpingectomy     left    No family history on file.  History  Substance Use Topics  . Smoking status: Current Everyday Smoker -- 0.2 packs/day  . Smokeless tobacco: Not on file  . Alcohol Use: Yes    Allergies: No Known Allergies  No prescriptions prior to admission    Review of Systems  Constitutional: Negative for fever.  Cardiovascular: Negative for chest pain.  Gastrointestinal: Positive for nausea and abdominal pain. Negative for vomiting, diarrhea, constipation and blood in stool.  Genitourinary: Negative for dysuria, urgency, frequency, hematuria and flank pain.  Neurological: Negative for dizziness and headaches.  Psychiatric/Behavioral: Negative for depression and suicidal ideas.   Physical Exam   Blood pressure 111/78, pulse 93, temperature 98 F (36.7 C), resp. rate 16, height 5\' 2"  (1.575 m), weight 155 lb 12.8 oz (70.67 kg), last menstrual period 06/02/2011.  Physical Exam  Constitutional: She is oriented to person, place, and time. She appears well-developed and well-nourished. No distress.  HENT:  Head: Normocephalic and atraumatic.  Eyes: EOM are normal. Pupils are equal, round, and reactive to light.  GI: Soft. She exhibits no distension and no mass. There is tenderness. There is no rebound and no guarding.  Genitourinary: There is bleeding around the vagina. No vaginal discharge found.       Cervix is Lg/closed. Uterus  is NL size and shape. There is fullness and tenderness to the left adnexa.  Neurological: She is alert and oriented to person, place, and time.  Skin: Skin is warm and dry. She is not diaphoretic.  Psychiatric: She has a normal mood and affect. Her behavior is normal. Judgment and thought content normal.    MAU Course  Procedures Care assumed from Henrietta Hoover, [place]  Radiology: US Transvaginal Non-ob  06/16/2011  *RADIOLOGY REPORT*  Clinical Data: Bleeding for 2 weeks, spontaneous abortion in July  TRANSVAGINAL ULTRASOUND OF PELVIS  Technique:  Transvaginal ultrasound examination of the pelvis was performed including evaluation of the uterus, ovaries, adnexal regions, and pelvic cul-de-sac.  Comparison:  04/24/2011  Findings:  Uterus:  Question retroflexed.  8.8 cm length by 5.2 cm AP by 6.7 cm transverse.  No focal uterine mass.  Endometrium: 10 mm thick, upper normal.  No endometrial fluid.  Right ovary: 4.2 x 2.7 x 2.6 cm in size.  Complex appearing area 2.1 x 2.0 x 1.7 cm, question hemorrhagic cyst.  No additional mass.  Left ovary: 2.2 x 1.4 x 1.9 cm.  Normal morphology without mass.  Other Findings:  No free fluid  IMPRESSION: Question retroflexed uterus without focal mass. Potential hemorrhagic cyst right ovary 2.1 x 2.0 x 1.7 cm in size. Recommend follow-up ultrasound in 2 months to assess for hemorrhagic cyst evolution and to exclude tumor.  Original Report Authenticated  By: Lollie Marrow, M.D.     Assessment and Plan   AUB Probable hemorrhagic right ovarian Cyst  Rx: Megace 40mg  po daily x 10 days FU [place] in 8 weeks, FU in Gyn Clinic after [place]       Pt care is being transferred to [facility], CNM.  Clinton Gallant. Rice III, DrHSc, MPAS, PA-C  06/16/2011, 5:11 PM

## 2011-06-16 NOTE — Progress Notes (Signed)
Onset of vaginal bleeding 2 weeks ago started off as pink in color, yesterday morning woke up with a lot of vaginal passing big clots, headaches, back pain and lower abdominal pain.

## 2011-06-16 NOTE — Progress Notes (Signed)
Pt states, " I've had my period for fourteen days, and I've had pain in my lower left abd for four days, and I'm passing clots size of quarters."

## 2011-06-16 NOTE — Discharge Instructions (Signed)
Abnormal Uterine Bleeding Abnormal uterine bleeding can have many causes. Some cases are simply treated, while others are more serious. There are several kinds of bleeding that is considered abnormal, including:  Bleeding between periods.   Bleeding after sexual intercourse.   Spotting anytime in the menstrual cycle.   Bleeding heavier or more than normal.   Bleeding after menopause.  CAUSES There are many causes of abnormal uterine bleeding. It can be present in teenagers, pregnant women, women during their reproductive years, and women who have reached menopause. Your caregiver will look for the more common causes depending on your age, signs, symptoms and your particular circumstance. Most cases are not serious and can be treated. Even the more serious causes, like cancer of the female organs, can be treated adequately if found in the early stages. That is why all types of bleeding should be evaluated and treated as soon as possible. DIAGNOSIS Diagnosing the cause may take several kinds of tests. Your caregiver may:  Take a complete history of the type of bleeding.   Perform a complete physical exam and Pap smear.   Take an ultrasound on the abdomen showing a picture of the female organs and the pelvis.   Inject dye into the uterus and Fallopian tubes and X-ray them (hysterosalpingogram).   Place fluid in the uterus and do an ultrasound (sonohysterogrqphy).   Take a CT scan to examine the female organs and pelvis.   Take an MRI to examine the female organs and pelvis. There is no X-ray involved with this procedure.   Look inside the uterus with a telescope that has a light at the end (hysteroscopy).   Scrap the inside of the uterus to get tissue to examine (Dilatation and Curettage, D&C).   Look into the pelvis with a telescope that has a light at the end (laparoscopy). This is done through a very small cut (incision) in the abdomen.  TREATMENT Treatment will depend on the  cause of the abnormal bleeding. It can include:  Doing nothing to allow the problem to take care of itself over time.   Hormone treatment.   Birth control pills.   Treating the medical condition causing the problem.   Laparoscopy.   Major or minor surgery   Destroying the lining of the uterus with electrical currant, laser, freezing or heat (uterine ablation).  HOME CARE INSTRUCTIONS  Follow your caregiver's recommendation on how to treat your problem.   See your caregiver if you missed a menstrual period and think you may be pregnant.   If you are bleeding heavily, count the number of pads/tampons you use and how often you have to change them. Tell this to your caregiver.   Avoid sexual intercourse until the problem is controlled.  SEEK MEDICAL CARE IF:  You have any kind of abnormal bleeding mentioned above.   You feel dizzy at times.   You are 30 years old and have not had a menstrual period yet.  SEEK IMMEDIATE MEDICAL CARE IF:  You pass out.   You are changing pads/tampons every 15 to 30 minutes.   You have belly (abdominal) pain.   You have a temperature of 100 F (37.8 C) or higher.   You become sweaty or weak.   You are passing large blood clots from the vagina.   You start to feel sick to your stomach (nauseous) and throw up (vomit).  Document Released: 10/15/2005 Document Re-Released: 08/12/2009 [facility] Patient Information 2011 [place], [place].Ovarian Cyst An ovarian cyst  is a sac filled with fluid or blood. This sac is attached to the ovary. Some cysts go away on their own. Other cysts need treatment. They should be looked at by your doctor.  HOME CARE  Take all medicines as told by your doctor.   Let your doctor know if you had a cyst on your ovary in the past.   Follow up with your doctor as told.   If your doctor is not a women's health specialist (gynecologist), you may need to make an appointment with one. It is important to find the cause  of the cyst.  GET HELP IF:  Your periods are late, not regular, or painful.   Your belly (abdomen) or pelvic pain does not go away.   You have pressure on your bladder.   You have pain during sex.   You feel fullness, pressure, or discomfort in your belly (abdomen).   You lose weight for no reason.   You have a temperature by mouth above 100.5.  GET HELP RIGHT AWAY IF:  You develop sudden, very bad pain.   Your belly becomes large or puffy (swollen).   You have a hard time peeing (totally emptying your bladder).   You feel sick most of the time.   You have a temperature by mouth above 100.5, not controlled by medicine.  MAKE SURE YOU:  Understand these instructions.   Will watch your condition.   Will get help right away if you are not doing well or get worse.  Document Released: 04/02/2008 Document Re-Released: 01/09/2010 [facility] Patient Information 2011 [place], [place].

## 2011-06-18 ENCOUNTER — Ambulatory Visit (HOSPITAL_COMMUNITY): Admission: RE | Admit: 2011-06-18 | Payer: Self-pay | Source: Ambulatory Visit

## 2011-06-18 NOTE — ED Provider Notes (Signed)
Agree with above note.  Kathryn Goodwin 06/18/2011 4:02 PM

## 2011-08-08 LAB — URINALYSIS, ROUTINE W REFLEX MICROSCOPIC
Glucose, UA: NEGATIVE
Protein, ur: NEGATIVE
Urobilinogen, UA: 0.2

## 2011-08-08 LAB — CBC
HCT: 32.9 — ABNORMAL LOW
Hemoglobin: 11.8 — ABNORMAL LOW
MCHC: 35.7
MCV: 93.4
RBC: 3.53 — ABNORMAL LOW
RDW: 12.6

## 2011-08-08 LAB — DIFFERENTIAL
Basophils Absolute: 0
Basophils Relative: 0
Eosinophils Absolute: 0.3
Eosinophils Relative: 3
Monocytes Absolute: 0.9 — ABNORMAL HIGH
Monocytes Relative: 8

## 2011-08-08 LAB — WET PREP, GENITAL: Yeast Wet Prep HPF POC: NONE SEEN

## 2011-08-08 LAB — GC/CHLAMYDIA PROBE AMP, GENITAL
Chlamydia, DNA Probe: NEGATIVE
GC Probe Amp, Genital: NEGATIVE

## 2011-08-09 LAB — URINALYSIS, ROUTINE W REFLEX MICROSCOPIC
Hgb urine dipstick: NEGATIVE
Nitrite: NEGATIVE
Protein, ur: NEGATIVE
Specific Gravity, Urine: 1.016
Urobilinogen, UA: 0.2

## 2011-08-10 ENCOUNTER — Ambulatory Visit (HOSPITAL_COMMUNITY): Admission: RE | Admit: 2011-08-10 | Payer: Self-pay | Source: Ambulatory Visit

## 2011-08-10 LAB — URINALYSIS, ROUTINE W REFLEX MICROSCOPIC
Bilirubin Urine: NEGATIVE
Glucose, UA: NEGATIVE
Glucose, UA: NEGATIVE
Hgb urine dipstick: NEGATIVE
Ketones, ur: NEGATIVE
Ketones, ur: NEGATIVE
Protein, ur: NEGATIVE
Protein, ur: NEGATIVE
Urobilinogen, UA: 0.2
pH: 5.5

## 2011-08-10 LAB — RAPID URINE DRUG SCREEN, HOSP PERFORMED
Benzodiazepines: NOT DETECTED
Cocaine: NOT DETECTED
Tetrahydrocannabinol: POSITIVE — AB

## 2011-08-13 LAB — ABO/RH: ABO/RH(D): A POS

## 2011-08-13 LAB — GC/CHLAMYDIA PROBE AMP, GENITAL
Chlamydia, DNA Probe: NEGATIVE
GC Probe Amp, Genital: NEGATIVE

## 2011-08-13 LAB — URINALYSIS, ROUTINE W REFLEX MICROSCOPIC
Ketones, ur: NEGATIVE
Nitrite: NEGATIVE
Protein, ur: NEGATIVE
Urobilinogen, UA: 0.2

## 2011-08-13 LAB — DIFFERENTIAL
Basophils Relative: 0
Lymphs Abs: 3.3
Monocytes Relative: 8
Neutro Abs: 8.4 — ABNORMAL HIGH
Neutrophils Relative %: 65

## 2011-08-13 LAB — CBC
MCHC: 34.4
RBC: 3.32 — ABNORMAL LOW
WBC: 12.9 — ABNORMAL HIGH

## 2011-11-21 ENCOUNTER — Inpatient Hospital Stay (HOSPITAL_COMMUNITY)
Admission: AD | Admit: 2011-11-21 | Discharge: 2011-11-21 | Disposition: A | Discharge status: Home / Self Care | Payer: Self-pay | Source: Ambulatory Visit | Attending: Obstetrics & Gynecology | Admitting: Obstetrics & Gynecology

## 2011-11-21 ENCOUNTER — Inpatient Hospital Stay (HOSPITAL_COMMUNITY): Payer: Self-pay

## 2011-11-21 ENCOUNTER — Encounter (HOSPITAL_COMMUNITY): Payer: Self-pay | Admitting: *Deleted

## 2011-11-21 DIAGNOSIS — N83201 Unspecified ovarian cyst, right side: Secondary | ICD-10-CM

## 2011-11-21 DIAGNOSIS — N949 Unspecified condition associated with female genital organs and menstrual cycle: Secondary | ICD-10-CM | POA: Insufficient documentation

## 2011-11-21 DIAGNOSIS — R102 Pelvic and perineal pain: Secondary | ICD-10-CM

## 2011-11-21 DIAGNOSIS — R109 Unspecified abdominal pain: Secondary | ICD-10-CM | POA: Insufficient documentation

## 2011-11-21 DIAGNOSIS — N83209 Unspecified ovarian cyst, unspecified side: Secondary | ICD-10-CM | POA: Insufficient documentation

## 2011-11-21 LAB — URINALYSIS, ROUTINE W REFLEX MICROSCOPIC
Hgb urine dipstick: NEGATIVE
Specific Gravity, Urine: 1.02 (ref 1.005–1.030)
Urobilinogen, UA: 0.2 mg/dL (ref 0.0–1.0)

## 2011-11-21 LAB — CBC
MCH: 30.7 pg (ref 26.0–34.0)
MCHC: 32.9 g/dL (ref 30.0–36.0)
Platelets: 233 10*3/uL (ref 150–400)
RDW: 12.6 % (ref 11.5–15.5)

## 2011-11-21 LAB — DIFFERENTIAL
Basophils Absolute: 0 10*3/uL (ref 0.0–0.1)
Basophils Relative: 0 % (ref 0–1)
Eosinophils Absolute: 0.1 10*3/uL (ref 0.0–0.7)
Neutro Abs: 4.5 10*3/uL (ref 1.7–7.7)
Neutrophils Relative %: 58 % (ref 43–77)

## 2011-11-21 LAB — WET PREP, GENITAL
Trich, Wet Prep: NONE SEEN
Yeast Wet Prep HPF POC: NONE SEEN

## 2011-11-21 LAB — POCT PREGNANCY, URINE: Preg Test, Ur: NEGATIVE

## 2011-11-21 MED ORDER — KETOROLAC TROMETHAMINE 60 MG/2ML IM SOLN
60.0000 mg | Freq: Once | INTRAMUSCULAR | Status: AC
  Administered 2011-11-21: 60 mg via INTRAMUSCULAR
  Filled 2011-11-21: qty 2

## 2011-11-21 MED ORDER — OXYCODONE-ACETAMINOPHEN 5-325 MG PO TABS: 1.0000 | ORAL_TABLET | Freq: Four times a day (QID) | ORAL | Status: DC | PRN

## 2011-11-21 NOTE — Progress Notes (Signed)
Pt states she started having abdominal pain 2days ago on her right side

## 2011-11-21 NOTE — ED Provider Notes (Signed)
History     Chief Complaint  Patient presents with  . Abdominal Pain   HPI Kathryn Goodwin is 31 y.o. [phone] Reports "sick on my stomach and weak for 2 weeks".  Yesterday more pain in her lower abdomen since yesterday.  LMP 10/16/11 this cycle lasted 12 days.  "always" has regular cycles.  Uses nothing for contraception.  Pregnancy would be ok.  1 sexual partner.   Denies vaginal discharge and no further bleeding since 11/07/11.  Denies hx of ovarian cysts.  Vomited X 3-4 yesterday.  Felt like she had a fever.  Has no appetite.  Per medical record, she had right hemorrhagic cyst by u/s here in August 2012--patient does not recall that visit.    Past Medical History  Diagnosis Date  . No pertinent past medical history   . Depression     Past Surgical History  Procedure Date  . Cesarean section   . Salpingectomy     left  . Salpingectomy 2007    left    History reviewed. No pertinent family history.  History  Substance Use Topics  . Smoking status: Current Everyday Smoker -- 0.2 packs/day  . Smokeless tobacco: Not on file  . Alcohol Use: Yes    Allergies: No Known Allergies  Prescriptions prior to admission  Medication Sig Dispense Refill  . acetaminophen (TYLENOL) 500 MG tablet Take 500 mg by mouth every 6 (six) hours as needed. For pain        Review of Systems  Constitutional: Positive for fever.  Gastrointestinal: Positive for nausea, vomiting and abdominal pain (right sided).  Genitourinary:       Negative for bleeding and discharge   Physical Exam   Blood pressure 121/71, pulse 85, temperature 98 F (36.7 C), temperature source Oral, resp. rate 16, height 5\' 1"  (1.549 m), weight 153 lb 3.2 oz (69.491 kg), last menstrual period 11/07/2011, SpO2 98.00%.  Physical Exam  Constitutional: She is oriented to person, place, and time. She appears well-developed and well-nourished. She appears distressed (uncomfortable).  HENT:  Head: Normocephalic.  Neck: Normal  range of motion.  Cardiovascular: Normal rate.   Respiratory: Effort normal.  GI: Soft. She exhibits no distension and no mass. There is tenderness (lower right sided). There is no rebound and no guarding.  Genitourinary: Uterus is tender (mild). Uterus is not deviated, not enlarged and not fixed. Cervix exhibits discharge. Cervix exhibits no motion tenderness and no friability. Right adnexum displays tenderness (uncomfortable on exam) and fullness. Right adnexum displays no mass. Left adnexum displays no mass, no tenderness and no fullness. No tenderness or bleeding around the vagina. Vaginal discharge (large amount of discharge with odor) found.  Neurological: She is alert and oriented to person, place, and time.  Skin: Skin is warm and dry.  Psychiatric: She has a normal mood and affect. Her behavior is normal.   Results for orders placed during the hospital encounter of 11/21/11 (from the past 24 hour(s))  URINALYSIS, ROUTINE W REFLEX MICROSCOPIC     Status: Normal   Collection Time   11/21/11  3:10 PM      Component Value Range   Color, Urine YELLOW  YELLOW    APPearance CLEAR  CLEAR    Specific Gravity, Urine 1.020  1.005 - 1.030    pH 7.0  5.0 - 8.0    Glucose, UA NEGATIVE  NEGATIVE (mg/dL)   Hgb urine dipstick NEGATIVE  NEGATIVE    Bilirubin Urine NEGATIVE  NEGATIVE  Ketones, ur NEGATIVE  NEGATIVE (mg/dL)   Protein, ur NEGATIVE  NEGATIVE (mg/dL)   Urobilinogen, UA 0.2  0.0 - 1.0 (mg/dL)   Nitrite NEGATIVE  NEGATIVE    Leukocytes, UA NEGATIVE  NEGATIVE   POCT PREGNANCY, URINE     Status: Normal   Collection Time   11/21/11  3:22 PM      Component Value Range   Preg Test, Ur NEGATIVE    WET PREP, GENITAL     Status: Abnormal   Collection Time   11/21/11  3:50 PM      Component Value Range   Yeast, Wet Prep NONE SEEN  NONE SEEN    Trich, Wet Prep NONE SEEN  NONE SEEN    Clue Cells, Wet Prep FEW (*) NONE SEEN    WBC, Wet Prep HPF POC FEW (*) NONE SEEN   CBC     Status:  Normal   Collection Time   11/21/11  4:05 PM      Component Value Range   WBC 7.7  4.0 - 10.5 (K/uL)   RBC 4.46  3.87 - 5.11 (MIL/uL)   Hemoglobin 13.7  12.0 - 15.0 (g/dL)   HCT 40.9  81.1 - 91.4 (%)   MCV 93.5  78.0 - 100.0 (fL)   MCH 30.7  26.0 - 34.0 (pg)   MCHC 32.9  30.0 - 36.0 (g/dL)   RDW 78.2  95.6 - 21.3 (%)   Platelets 233  150 - 400 (K/uL)  DIFFERENTIAL     Status: Normal   Collection Time   11/21/11  4:05 PM      Component Value Range   Neutrophils Relative 58  43 - 77 (%)   Neutro Abs 4.5  1.7 - 7.7 (K/uL)   Lymphocytes Relative 31  12 - 46 (%)   Lymphs Abs 2.4  0.7 - 4.0 (K/uL)   Monocytes Relative 10  3 - 12 (%)   Monocytes Absolute 0.7  0.1 - 1.0 (K/uL)   Eosinophils Relative 1  0 - 5 (%)   Eosinophils Absolute 0.1  0.0 - 0.7 (K/uL)   Basophils Relative 0  0 - 1 (%)   Basophils Absolute 0.0  0.0 - 0.1 (K/uL)   Clinical Data: Right-sided pelvic pain  TRANSVAGINAL ULTRASOUND OF PELVIS  Technique: Transvaginal ultrasound examination of the pelvis was  performed including evaluation of the uterus, ovaries, adnexal  regions, and pelvic cul-de-sac.  Comparison: CT 11/10/2011  Findings:  Uterus: Anteverted, retroflexed. This renders visualization of  the fundus suboptimal. 9.0 x 4.7 x 4.0 cm. No definite focal  abnormality.  Endometrium: 1.7 cm. Uniformly echogenic without focal  abnormality.  Right ovary: 3.9 x 3.0 x 2.7 cm. Cyst with lace-like reticular  internal echoes measures 2.4 x 1.8 x 1.9 cm.  Left ovary: Normal appearance/no adnexal mass  Other Findings: No free fluid  IMPRESSION:  Probable hemorrhagic right ovarian cyst. Recommend pelvic  ultrasound in 6-8 weeks to document resolution. This could account  for the patient's symptoms of right-sided pelvic pain.  Original Report Authenticated By: Harrel Lemon, M.D.       MAU Course  Procedures  GC/CHL culture to lab  MDM Toradol 60mg  Im ordered for pain.  She is feeling much better after  returning from Ultrasound.    Assessment and Plan  A:  Right hemorrhagic ovarian cyst.      Pelvic pain P: Rx for Percocet 5/325mg  po q6hrs prn pain #15  Email sent to GYN CLINIC to call for a followup ultrasound and exam  (see U/S recommendation for resolution of cyst)  Skylah Delauter,EVE M 11/21/2011, 3:35 PM   Matt Holmes, NP 11/21/11 1737

## 2011-11-21 NOTE — Progress Notes (Signed)
Patient states she has had nausea, some vomiting and fatigue for about 2 weeks. Has been having right lower abdominal pain for 2 days. Started having chills and possible a fever last night. States her last period was about one week early and lasted for 12 days, not typical for her. No bleeding or discharge at this time.

## 2011-11-22 LAB — GC/CHLAMYDIA PROBE AMP, GENITAL: Chlamydia, DNA Probe: NEGATIVE

## 2011-11-22 NOTE — ED Provider Notes (Signed)
Attestation of Attending Supervision of Advanced Practitioner: Evaluation and management procedures were performed by the PA/NP/CNM/OB Fellow under my supervision/collaboration. Chart reviewed, and agree with management and plan.  Jaynie Collins, M.D. 11/22/2011 3:59 PM

## 2011-11-27 ENCOUNTER — Encounter (HOSPITAL_COMMUNITY): Payer: Self-pay | Admitting: *Deleted

## 2011-11-27 ENCOUNTER — Emergency Department (HOSPITAL_COMMUNITY)
Admission: EM | Admit: 2011-11-27 | Discharge: 2011-11-28 | Disposition: A | Discharge status: Home / Self Care | Payer: Self-pay | Attending: Emergency Medicine | Admitting: Emergency Medicine

## 2011-11-27 DIAGNOSIS — R0602 Shortness of breath: Secondary | ICD-10-CM | POA: Insufficient documentation

## 2011-11-27 DIAGNOSIS — R509 Fever, unspecified: Secondary | ICD-10-CM | POA: Insufficient documentation

## 2011-11-27 DIAGNOSIS — S025XXA Fracture of tooth (traumatic), initial encounter for closed fracture: Secondary | ICD-10-CM | POA: Insufficient documentation

## 2011-11-27 DIAGNOSIS — K029 Dental caries, unspecified: Secondary | ICD-10-CM | POA: Insufficient documentation

## 2011-11-27 DIAGNOSIS — H9209 Otalgia, unspecified ear: Secondary | ICD-10-CM | POA: Insufficient documentation

## 2011-11-27 DIAGNOSIS — J069 Acute upper respiratory infection, unspecified: Secondary | ICD-10-CM | POA: Insufficient documentation

## 2011-11-27 DIAGNOSIS — R05 Cough: Secondary | ICD-10-CM | POA: Insufficient documentation

## 2011-11-27 DIAGNOSIS — J3489 Other specified disorders of nose and nasal sinuses: Secondary | ICD-10-CM | POA: Insufficient documentation

## 2011-11-27 DIAGNOSIS — R079 Chest pain, unspecified: Secondary | ICD-10-CM | POA: Insufficient documentation

## 2011-11-27 DIAGNOSIS — X58XXXA Exposure to other specified factors, initial encounter: Secondary | ICD-10-CM | POA: Insufficient documentation

## 2011-11-27 DIAGNOSIS — R059 Cough, unspecified: Secondary | ICD-10-CM | POA: Insufficient documentation

## 2011-11-27 DIAGNOSIS — K089 Disorder of teeth and supporting structures, unspecified: Secondary | ICD-10-CM | POA: Insufficient documentation

## 2011-11-27 NOTE — ED Notes (Signed)
Cold cough headache runny nose and toothache for 5 days

## 2011-11-28 ENCOUNTER — Emergency Department (HOSPITAL_COMMUNITY): Payer: Self-pay

## 2011-11-28 LAB — URINALYSIS, ROUTINE W REFLEX MICROSCOPIC
Bilirubin Urine: NEGATIVE
Glucose, UA: NEGATIVE mg/dL
Nitrite: NEGATIVE
Specific Gravity, Urine: 1.029 (ref 1.005–1.030)
pH: 5.5 (ref 5.0–8.0)

## 2011-11-28 MED ORDER — HYDROCODONE-ACETAMINOPHEN 5-325 MG PO TABS: 2.0000 | ORAL_TABLET | ORAL | Status: DC | PRN

## 2011-11-28 MED ORDER — ACETAMINOPHEN-CODEINE #3 300-30 MG PO TABS
1.0000 | ORAL_TABLET | Freq: Once | ORAL | Status: AC
  Administered 2011-11-28: 1 via ORAL
  Filled 2011-11-28: qty 1

## 2011-11-28 MED ORDER — AMOXICILLIN-POT CLAVULANATE 875-125 MG PO TABS: 1.0000 | ORAL_TABLET | Freq: Two times a day (BID) | ORAL | Status: DC

## 2011-11-28 NOTE — ED Provider Notes (Signed)
History     CSN: [id number]  Arrival date & time 11/27/11  2142   First MD Initiated Contact with Patient 11/28/11 0013      Chief Complaint  Patient presents with  . Chills    (Consider location/radiation/quality/duration/timing/severity/associated sxs/prior treatment) HPI  C/o cough x 5 days, productive of yellow sputum. C/O Lt ear pain, sinus pressure, nasal congestion, rhinorrhea. SOB with coughing only, CP with coughing only. Fever to 100.2, last today. Has been taking motrin at home. Denies sick contacts.   Denies abdominal pain. Min nausea, no vomiting. Denies hematuria/dysuria/freq/urgency. Denies vaginal discharge.  Toothache x 5 days as well L lower molars. States she chipped her tooth after eating hard candy. Min bleeding from site.    ED Notes, ED Provider Notes from 11/27/11 0000 to 11/27/11 22:22:32       Wynona Canes Chrisco, RN 11/27/2011 22:18      Cold cough headache runny nose and toothache for 5 days     Past Medical History  Diagnosis Date  . No pertinent past medical history   . Depression     Past Surgical History  Procedure Date  . Cesarean section   . Salpingectomy     left  . Salpingectomy 2007    left    History reviewed. No pertinent family history.  History  Substance Use Topics  . Smoking status: Current Everyday Smoker -- 0.2 packs/day  . Smokeless tobacco: Not on file  . Alcohol Use: Yes    OB History    Grav Para Term Preterm Abortions TAB SAB Ect Mult Living   6 3   3  2 1  3       Review of Systems  All other systems reviewed and are negative.   except as noted HPI   Allergies  Review of patient's allergies indicates no known allergies.  Home Medications   Current Outpatient Rx  Name Route Sig Dispense Refill  . IBUPROFEN 800 MG PO TABS Oral Take 800 mg by mouth every 8 (eight) hours as needed. For pain    . ADULT MULTIVITAMIN W/MINERALS CH Oral Take 1 tablet by mouth daily.    . AMOXICILLIN-POT CLAVULANATE 875-125  MG PO TABS Oral Take 1 tablet by mouth every 12 (twelve) hours. 20 tablet 0  . HYDROCODONE-ACETAMINOPHEN 5-325 MG PO TABS Oral Take 2 tablets by mouth every 4 (four) hours as needed for pain. 10 tablet 0    BP 115/66  Pulse 78  Temp(Src) 97.2 F (36.2 C) (Oral)  Resp 19  SpO2 99%  LMP 10/24/2011  Physical Exam  Nursing note and vitals reviewed. Constitutional: She is oriented to person, place, and time. She appears well-developed.  HENT:  Head: Atraumatic.  Mouth/Throat: Oropharynx is clear and moist.    Eyes: Conjunctivae and EOM are normal. Pupils are equal, round, and reactive to light.  Neck: Normal range of motion. Neck supple.  Cardiovascular: Normal rate, regular rhythm, normal heart sounds and intact distal pulses.   Pulmonary/Chest: Effort normal and breath sounds normal. No respiratory distress. She has no wheezes. She has no rales.  Abdominal: Soft. She exhibits no distension. There is no tenderness. There is no rebound and no guarding.  Musculoskeletal: Normal range of motion.  Neurological: She is alert and oriented to person, place, and time.  Skin: Skin is warm and dry. No rash noted.  Psychiatric: She has a normal mood and affect.    ED Course  Procedures (including critical care time)   Labs  Reviewed  URINALYSIS, ROUTINE W REFLEX MICROSCOPIC  PREGNANCY, URINE  LAB REPORT - SCANNED   No results found.   1. Tooth decay   2. URI (upper respiratory infection)   3. Tooth fracture       MDM  URI sx. Tooth decay and tooth fx x 5 days ago. No obvious abscess. Given h/o fevers will prescribed Abx. Pain control. Needs dental referral.         Forbes Cellar, MD 11/30/11 [phone]

## 2011-11-28 NOTE — ED Notes (Signed)
Patient presents with c/o of dry cough for about 1 week, chills, and toothache.

## 2011-12-03 ENCOUNTER — Inpatient Hospital Stay (HOSPITAL_COMMUNITY)
Admission: AD | Admit: 2011-12-03 | Discharge: 2011-12-04 | Disposition: A | Discharge status: Home / Self Care | Payer: Self-pay | Source: Ambulatory Visit | Attending: Obstetrics & Gynecology | Admitting: Obstetrics & Gynecology

## 2011-12-03 DIAGNOSIS — F329 Major depressive disorder, single episode, unspecified: Secondary | ICD-10-CM | POA: Insufficient documentation

## 2011-12-03 DIAGNOSIS — R51 Headache: Secondary | ICD-10-CM | POA: Insufficient documentation

## 2011-12-03 DIAGNOSIS — F3289 Other specified depressive episodes: Secondary | ICD-10-CM | POA: Insufficient documentation

## 2011-12-03 DIAGNOSIS — M436 Torticollis: Secondary | ICD-10-CM | POA: Insufficient documentation

## 2011-12-03 NOTE — Progress Notes (Signed)
PT SAYS NAUSEA NOT AS BAD.     SAYS DOES NOT FEEL LIKE SHE DID WHEN CAME IN- THOUGHT SHE WAS HAVING PANIC ATTACK-  HAS HX- 3 IN PAST

## 2011-12-03 NOTE — Progress Notes (Signed)
PT SAYS SHE HAS BEEN HAVING H/A X2 DAYS   L TEMPLE HURTS AND  L NECK AND  BEHIND  EAR..  WAS HERE 2 WEEKS AGO- FOR CYST- GAVE RX   FOR HYDROCODONE-    TOOK  1 TAB AT 2 PM FOR CYST.  TOOK ALEVE AT 830.  THIS FEELING STARTED AFTER  TOOK ALEVE.Kathryn Goodwin

## 2011-12-03 NOTE — Progress Notes (Signed)
NO BIRTH CONTROL

## 2011-12-04 LAB — URINALYSIS, ROUTINE W REFLEX MICROSCOPIC
Glucose, UA: NEGATIVE mg/dL
Leukocytes, UA: NEGATIVE
Protein, ur: NEGATIVE mg/dL
Specific Gravity, Urine: 1.02 (ref 1.005–1.030)
Urobilinogen, UA: 0.2 mg/dL (ref 0.0–1.0)

## 2011-12-04 NOTE — ED Provider Notes (Signed)
History     CSN: [id number]  Arrival date & time 12/03/11  2309   None     No chief complaint on file.  HPI Kathryn Goodwin is a 31 y.o. female who presents to MAU for left side of neck and pain in left temple. States she feels short of breath sometimes. States she has panic attacks and is under a lot of stress. She has 3 children and the father of the oldest was murdered. She denies feeling of wanting to harm herself or others. She has a good support system with her mother and other family. She states sometimes she just feels like she needs to talk. States she has had a headache all day. Has a history of migraines but this feels different. Has had nausea but not vomiting.    Past Medical History  Diagnosis Date  . No pertinent past medical history   . Depression     Past Surgical History  Procedure Date  . Cesarean section   . Salpingectomy     left  . Salpingectomy 2007    left    No family history on file.  History  Substance Use Topics  . Smoking status: Current Everyday Smoker -- 0.2 packs/day  . Smokeless tobacco: Not on file  . Alcohol Use: Yes    OB History    Grav Para Term Preterm Abortions TAB SAB Ect Mult Living   6 3   3  2 1  3       Review of Systems  Constitutional: Positive for appetite change and fatigue. Negative for fever, chills and diaphoresis.  HENT: Positive for neck pain. Negative for ear pain, congestion, sore throat, facial swelling, neck stiffness, dental problem and sinus pressure.   Eyes: Positive for photophobia. Negative for pain, discharge and visual disturbance.  Respiratory: Negative for cough, chest tightness and wheezing.   Cardiovascular: Negative.   Gastrointestinal: Positive for nausea and abdominal pain (cyst on right ovary). Negative for vomiting, diarrhea, constipation and abdominal distention.  Genitourinary: Positive for frequency. Negative for dysuria, flank pain, vaginal bleeding, vaginal discharge, difficulty urinating  and dyspareunia.  Musculoskeletal: Negative for myalgias, back pain and gait problem.  Skin: Negative for color change and rash.  Neurological: Positive for dizziness, light-headedness and headaches. Negative for speech difficulty, weakness and numbness.  Psychiatric/Behavioral: Negative for hallucinations, confusion and agitation. The patient is nervous/anxious.     Allergies  Review of patient's allergies indicates no known allergies.  Home Medications  No current outpatient prescriptions on file.  BP 145/74  Pulse 71  Temp(Src) 98 F (36.7 C) (Oral)  Resp 22  Ht 5\' 2"  (1.575 m)  Wt 155 lb (70.308 kg)  BMI 28.35 kg/m2  SpO2 99%  LMP 11/06/2010  Physical Exam  Nursing note and vitals reviewed. Constitutional: She is oriented to person, place, and time. She appears well-developed and well-nourished.  HENT:  Head: Normocephalic.  Eyes: EOM are normal. Right conjunctiva is injected. Left conjunctiva is injected. Left eye exhibits no nystagmus.  Neck: Neck supple. Muscular tenderness (left side) present.  Cardiovascular: Normal rate and regular rhythm.   Pulmonary/Chest: Effort normal and breath sounds normal.  Abdominal: Soft. There is no tenderness.  Musculoskeletal: Normal range of motion.  Neurological: She is alert and oriented to person, place, and time. She has normal strength and normal reflexes. No cranial nerve deficit or sensory deficit. She displays a negative Romberg sign. Coordination and gait normal.  Skin: Skin is warm and dry.  Psychiatric: Her behavior is normal. Judgment and thought content normal. Her mood appears anxious. She exhibits a depressed mood.   Results for orders placed during the hospital encounter of 12/03/11 (from the past 24 hour(s))  URINALYSIS, ROUTINE W REFLEX MICROSCOPIC     Status: Normal   Collection Time   12/03/11 11:28 PM      Component Value Range   Color, Urine YELLOW  YELLOW    APPearance CLEAR  CLEAR    Specific Gravity, Urine  1.020  1.005 - 1.030    pH 5.5  5.0 - 8.0    Glucose, UA NEGATIVE  NEGATIVE (mg/dL)   Hgb urine dipstick NEGATIVE  NEGATIVE    Bilirubin Urine NEGATIVE  NEGATIVE    Ketones, ur NEGATIVE  NEGATIVE (mg/dL)   Protein, ur NEGATIVE  NEGATIVE (mg/dL)   Urobilinogen, UA 0.2  0.0 - 1.0 (mg/dL)   Nitrite NEGATIVE  NEGATIVE    Leukocytes, UA NEGATIVE  NEGATIVE    UPT is negative  ED Course: Spoke with Dr. Alto Denver at [facility] ED. I will discharge the patient from MAU and she will go by private car with her friend to [facility].   Procedures  Assessment: Headache   Torticollis   Depression/Anxiety  Plan:  Patient's friend to take her to [facility] ED for further evaluation   Return here as needed for GYN problems.         [place], [place] 12/04/11 [phone]

## 2012-02-09 ENCOUNTER — Encounter (HOSPITAL_COMMUNITY): Payer: Self-pay

## 2012-02-09 ENCOUNTER — Inpatient Hospital Stay (HOSPITAL_COMMUNITY)
Admission: AD | Admit: 2012-02-09 | Discharge: 2012-02-10 | Disposition: A | Discharge status: Home / Self Care | Payer: Self-pay | Source: Ambulatory Visit | Attending: Obstetrics & Gynecology | Admitting: Obstetrics & Gynecology

## 2012-02-09 DIAGNOSIS — R1013 Epigastric pain: Secondary | ICD-10-CM | POA: Insufficient documentation

## 2012-02-09 DIAGNOSIS — O99891 Other specified diseases and conditions complicating pregnancy: Secondary | ICD-10-CM | POA: Insufficient documentation

## 2012-02-09 DIAGNOSIS — K219 Gastro-esophageal reflux disease without esophagitis: Secondary | ICD-10-CM | POA: Insufficient documentation

## 2012-02-09 DIAGNOSIS — O26859 Spotting complicating pregnancy, unspecified trimester: Secondary | ICD-10-CM | POA: Insufficient documentation

## 2012-02-09 DIAGNOSIS — O209 Hemorrhage in early pregnancy, unspecified: Secondary | ICD-10-CM

## 2012-02-09 LAB — URINALYSIS, ROUTINE W REFLEX MICROSCOPIC
Glucose, UA: NEGATIVE mg/dL
Hgb urine dipstick: NEGATIVE
Specific Gravity, Urine: >1.03 — ABNORMAL HIGH (ref 1.005–1.030)

## 2012-02-09 MED ORDER — GI COCKTAIL ~~LOC~~
30.0000 mL | Freq: Once | ORAL | Status: AC
  Administered 2012-02-10: 30 mL via ORAL
  Filled 2012-02-09: qty 30

## 2012-02-09 NOTE — MAU Note (Signed)
Patient is here with c/o upper abdominal sharp pains, that radiates to her right  Lower back, she states  that it started yesterday without relief from 1000mg  tylenol. She states that she has light spotting as well. She denies dysuria.

## 2012-02-09 NOTE — MAU Provider Note (Signed)
History   Pt presents today c/o severe epigastric pain that radiates around her right side and into her back. She states the pain has worsened over the past couple of days and is worse when she eats. She has also noticed some vag spotting and she reports she is about 2wks late on her menses.  CSN: [id number]  Arrival date and time: 02/09/12 2243   First Provider Initiated Contact with Patient 02/09/12 2348      Chief Complaint  Patient presents with  . Back Pain  . Abdominal Pain  . Vaginal Bleeding   HPI  OB History    Grav Para Term Preterm Abortions TAB SAB Ect Mult Living   6 3   3  2 1  3       Past Medical History  Diagnosis Date  . No pertinent past medical history   . Depression     Past Surgical History  Procedure Date  . Cesarean section   . Salpingectomy     left  . Salpingectomy 2007    left    History reviewed. No pertinent family history.  History  Substance Use Topics  . Smoking status: Current Everyday Smoker -- 0.2 packs/day  . Smokeless tobacco: Not on file  . Alcohol Use: Yes    Allergies: No Known Allergies  Prescriptions prior to admission  Medication Sig Dispense Refill  . acetaminophen (TYLENOL) 325 MG tablet Take 650 mg by mouth every 6 (six) hours as needed. For pain      . diazepam (VALIUM) 10 MG tablet Take 10 mg by mouth every 6 (six) hours as needed. As needed for anxiety      . Multiple Vitamin (MULITIVITAMIN WITH MINERALS) TABS Take 1 tablet by mouth daily.        Review of Systems  Constitutional: Negative for fever and chills.  Eyes: Negative for blurred vision and double vision.  Respiratory: Negative for cough, hemoptysis, sputum production, shortness of breath and wheezing.   Cardiovascular: Negative for chest pain and palpitations.  Gastrointestinal: Positive for nausea, vomiting and abdominal pain. Negative for diarrhea and constipation.  Genitourinary: Negative for dysuria, urgency, frequency and hematuria.    Neurological: Negative for dizziness and headaches.  Psychiatric/Behavioral: Negative for depression and suicidal ideas.   Physical Exam   Blood pressure 115/70, pulse 97, temperature 97.2 F (36.2 C), temperature source Oral, resp. rate 18.  Physical Exam  Nursing note and vitals reviewed. Constitutional: She is oriented to person, place, and time. She appears well-developed and well-nourished. No distress.  HENT:  Head: Normocephalic and atraumatic.  Eyes: EOM are normal. Pupils are equal, round, and reactive to light.  GI: Soft. She exhibits no distension and no mass. There is tenderness (Epigastric and RUQ pain with palpation. ). There is no rebound and no guarding.  Genitourinary: No bleeding around the vagina. No vaginal discharge found.       Cervix Lg/closed.  Neurological: She is alert and oriented to person, place, and time.  Skin: Skin is warm and dry. She is not diaphoretic.  Psychiatric: She has a normal mood and affect. Her behavior is normal. Judgment and thought content normal.    MAU Course  Procedures  Results for orders placed during the hospital encounter of 02/09/12 (from the past 24 hour(s))  URINALYSIS, ROUTINE W REFLEX MICROSCOPIC     Status: Abnormal   Collection Time   02/09/12 11:00 PM      Component Value Range   Color, Urine YELLOW  YELLOW    APPearance CLEAR  CLEAR    Specific Gravity, Urine >1.030 (*) 1.005 - 1.030    pH 6.0  5.0 - 8.0    Glucose, UA NEGATIVE  NEGATIVE (mg/dL)   Hgb urine dipstick NEGATIVE  NEGATIVE    Bilirubin Urine NEGATIVE  NEGATIVE    Ketones, ur NEGATIVE  NEGATIVE (mg/dL)   Protein, ur NEGATIVE  NEGATIVE (mg/dL)   Urobilinogen, UA 1.0  0.0 - 1.0 (mg/dL)   Nitrite NEGATIVE  NEGATIVE    Leukocytes, UA NEGATIVE  NEGATIVE   POCT PREGNANCY, URINE     Status: Abnormal   Collection Time   02/09/12 11:49 PM      Component Value Range   Preg Test, Ur POSITIVE (*) NEGATIVE   WET PREP, GENITAL     Status: Abnormal   Collection  Time   02/09/12 11:51 PM      Component Value Range   Yeast Wet Prep HPF POC NONE SEEN  NONE SEEN    Trich, Wet Prep NONE SEEN  NONE SEEN    Clue Cells Wet Prep HPF POC FEW (*) NONE SEEN    WBC, Wet Prep HPF POC FEW (*) NONE SEEN   CBC     Status: Abnormal   Collection Time   02/09/12 11:56 PM      Component Value Range   WBC 12.0 (*) 4.0 - 10.5 (K/uL)   RBC 4.40  3.87 - 5.11 (MIL/uL)   Hemoglobin 13.1  12.0 - 15.0 (g/dL)   HCT 16.1  09.6 - 04.5 (%)   MCV 91.1  78.0 - 100.0 (fL)   MCH 29.8  26.0 - 34.0 (pg)   MCHC 32.7  30.0 - 36.0 (g/dL)   RDW 40.9  81.1 - 91.4 (%)   Platelets 265  150 - 400 (K/uL)  HCG, QUANTITATIVE, PREGNANCY     Status: Abnormal   Collection Time   02/10/12 12:03 AM      Component Value Range   hCG, Beta Chain, Quant, S 270 (*) <5 (mIU/mL)   US shows no evidence of cholelithiasis or cholecystitis. No evidence of intrauterine or ectopic preg.  Assessment and Plan  Reflux: discussed with pt at length. Will give Rx for Zantac. Discussed diet, activity,risks, and precautions.  Spotting in early preg: pt with an hx of ectopic preg. She will return in 2 days for repeat B-quant. Discussed signs and sx of ectopic preg with pt at length. Discussed diet, activity, risks, and precautions.  Clinton Gallant. Dolton Shaker III, DrHSc, MPAS, PA-C  02/09/2012, 11:55 PM

## 2012-02-10 ENCOUNTER — Inpatient Hospital Stay (HOSPITAL_COMMUNITY): Payer: Self-pay

## 2012-02-10 ENCOUNTER — Encounter (HOSPITAL_COMMUNITY): Payer: Self-pay

## 2012-02-10 LAB — CBC
HCT: 40.1 % (ref 36.0–46.0)
Hemoglobin: 13.1 g/dL (ref 12.0–15.0)
MCV: 91.1 fL (ref 78.0–100.0)
Platelets: 265 10*3/uL (ref 150–400)
RBC: 4.4 MIL/uL (ref 3.87–5.11)
WBC: 12 10*3/uL — ABNORMAL HIGH (ref 4.0–10.5)

## 2012-02-10 LAB — WET PREP, GENITAL
Trich, Wet Prep: NONE SEEN
Yeast Wet Prep HPF POC: NONE SEEN

## 2012-02-10 MED ORDER — KETOROLAC TROMETHAMINE 60 MG/2ML IM SOLN: 60.0000 mg | Freq: Once | INTRAMUSCULAR | Status: DC

## 2012-02-10 MED ORDER — RANITIDINE HCL 150 MG PO TABS: 150.0000 mg | ORAL_TABLET | Freq: Two times a day (BID) | ORAL | Status: DC

## 2012-02-10 MED ORDER — HYDROMORPHONE HCL PF 1 MG/ML IJ SOLN
1.0000 mg | Freq: Once | INTRAMUSCULAR | Status: AC
  Administered 2012-02-10: 1 mg via INTRAMUSCULAR
  Filled 2012-02-10 (×2): qty 1

## 2012-02-10 NOTE — Discharge Instructions (Signed)
Diet for GERD or PUD Nutrition therapy can help ease the discomfort of gastroesophageal reflux disease (GERD) and peptic ulcer disease (PUD).  HOME CARE INSTRUCTIONS   Eat your meals slowly, in a relaxed setting.   Eat 5 to 6 small meals per day.   If a food causes distress, stop eating it for a period of time.  FOODS TO AVOID  Coffee, regular or decaffeinated.   Cola beverages, regular or low calorie.   Tea, regular or decaffeinated.   Pepper.   Cocoa.   High fat foods, including meats.   Butter, margarine, hydrogenated oil (trans fats).   Peppermint or spearmint (if you have GERD).   Fruits and vegetables if not tolerated.   Alcohol.   Nicotine (smoking or chewing). This is one of the most potent stimulants to acid production in the gastrointestinal tract.   Any food that seems to aggravate your condition.  If you have questions regarding your diet, ask your caregiver or a registered dietitian. TIPS  Lying flat may make symptoms worse. Keep the head of your bed raised 6 to 9 inches (15 to 23 cm) by using a foam wedge or blocks under the legs of the bed.   Do not lay down until 3 hours after eating a meal.   Daily physical activity may help reduce symptoms.  MAKE SURE YOU:   Understand these instructions.   Will watch your condition.   Will get help right away if you are not doing well or get worse.  Document Released: 10/15/2005 Document Revised: 10/04/2011 Document Reviewed: 08/31/2011 Exit[employer] Patient Information 2012 [employer]. 

## 2012-02-12 ENCOUNTER — Inpatient Hospital Stay (HOSPITAL_COMMUNITY)
Admission: AD | Admit: 2012-02-12 | Discharge: 2012-02-12 | Disposition: A | Discharge status: Home / Self Care | Payer: Self-pay | Source: Ambulatory Visit | Attending: Obstetrics and Gynecology | Admitting: Obstetrics and Gynecology

## 2012-02-12 DIAGNOSIS — O99891 Other specified diseases and conditions complicating pregnancy: Secondary | ICD-10-CM | POA: Insufficient documentation

## 2012-02-12 DIAGNOSIS — Z09 Encounter for follow-up examination after completed treatment for conditions other than malignant neoplasm: Secondary | ICD-10-CM

## 2012-02-12 DIAGNOSIS — R1013 Epigastric pain: Secondary | ICD-10-CM | POA: Insufficient documentation

## 2012-02-12 MED ORDER — GI COCKTAIL ~~LOC~~
30.0000 mL | Freq: Once | ORAL | Status: AC
  Administered 2012-02-12: 30 mL via ORAL
  Filled 2012-02-12: qty 30

## 2012-02-12 NOTE — MAU Note (Signed)
Patient to MAU for repeat BHCG. Patient denies any bleeding but does report mild upper abdominal pain on and off.

## 2012-02-12 NOTE — Discharge Instructions (Signed)
Return for follow up ultrasound. No sex, no tampons, nothing in the vagina until follow up. Return immediately for increased pain, bleeding, feeling weak and dizzy or other problems.

## 2012-02-12 NOTE — MAU Provider Note (Signed)
Kathryn Goodwin is a 31 y.o. female @ [redacted]w[redacted]d gestation who presents to MAU for follow up Bhcg. She denies bleeding and only mild pain in upper abdomen today. Bhcg 2 days ago was 270 and today has increased to 698.   Results for orders placed during the hospital encounter of 02/12/12 (from the past 24 hour(s))  HCG, QUANTITATIVE, PREGNANCY     Status: Abnormal   Collection Time   02/12/12  6:45 PM      Component Value Range   hCG, Beta Chain, Quant, S 698 (*) <5 (mIU/mL)   Patient called from lobby x 3 and is no in lobby.  Will call patient at home and schedule for follow up ultrasound in 5 days. Patient to return immediately for severe pain. Strict ectopic precautions given. (Patient with history of previous ectopic)  2138  Client returned.  Discharge papers given.  Having severe epigastric pain.  Is taking Zantac as ordered previously and tylenol and ibuprofen.  Client has difficulty walking upright as pain is severe.    Assessment Epigastric pain  - likely unrelated to pregnancy and unresponsive to current medications.  No vomiting.  No diarrhea.  Plan Will give GI cocktail to see if pain will resolve. 2206 Pain not completely resolved but not as severe. Discussed at length.  Reviewed diet to decrease gastric acid. Advised to see urgent care or ER if epigastric pain worsens.  Likely unrelated to pregnancy Advised to continue Zantac and use OTC Mylanta or Tums as needed. Expect call to schedule pelvic ultrasound.

## 2012-02-12 NOTE — MAU Note (Signed)
COMPUTER WOULD NOT LET   E- SIGN WORK

## 2012-02-13 NOTE — MAU Provider Note (Signed)
Agree with above note.  Seward Coran 02/13/2012 7:02 AM

## 2012-02-18 ENCOUNTER — Ambulatory Visit (HOSPITAL_COMMUNITY)
Admission: RE | Admit: 2012-02-18 | Discharge: 2012-02-18 | Disposition: A | Discharge status: Home / Self Care | Payer: Self-pay | Source: Ambulatory Visit | Attending: Nurse Practitioner | Admitting: Nurse Practitioner

## 2012-02-18 DIAGNOSIS — O3680X0 Pregnancy with inconclusive fetal viability, not applicable or unspecified: Secondary | ICD-10-CM | POA: Insufficient documentation

## 2012-02-18 DIAGNOSIS — O09299 Supervision of pregnancy with other poor reproductive or obstetric history, unspecified trimester: Secondary | ICD-10-CM | POA: Insufficient documentation

## 2012-02-19 ENCOUNTER — Emergency Department (HOSPITAL_COMMUNITY)
Admission: EM | Admit: 2012-02-19 | Discharge: 2012-02-19 | Disposition: A | Discharge status: Home / Self Care | Payer: Self-pay | Attending: Emergency Medicine | Admitting: Emergency Medicine

## 2012-02-19 ENCOUNTER — Encounter (HOSPITAL_COMMUNITY): Payer: Self-pay | Admitting: *Deleted

## 2012-02-19 DIAGNOSIS — M542 Cervicalgia: Secondary | ICD-10-CM | POA: Insufficient documentation

## 2012-02-19 DIAGNOSIS — M62838 Other muscle spasm: Secondary | ICD-10-CM | POA: Insufficient documentation

## 2012-02-19 DIAGNOSIS — R51 Headache: Secondary | ICD-10-CM | POA: Insufficient documentation

## 2012-02-19 DIAGNOSIS — M25519 Pain in unspecified shoulder: Secondary | ICD-10-CM | POA: Insufficient documentation

## 2012-02-19 DIAGNOSIS — F17200 Nicotine dependence, unspecified, uncomplicated: Secondary | ICD-10-CM | POA: Insufficient documentation

## 2012-02-19 MED ORDER — HYDROCODONE-ACETAMINOPHEN 5-500 MG PO TABS: 1.0000 | ORAL_TABLET | Freq: Four times a day (QID) | ORAL | Status: AC | PRN

## 2012-02-19 MED ORDER — DIAZEPAM 5 MG PO TABS
5.0000 mg | ORAL_TABLET | Freq: Once | ORAL | Status: AC
  Administered 2012-02-19: 5 mg via ORAL
  Filled 2012-02-19: qty 1

## 2012-02-19 MED ORDER — IBUPROFEN 600 MG PO TABS: 600.0000 mg | ORAL_TABLET | Freq: Three times a day (TID) | ORAL | Status: AC | PRN

## 2012-02-19 MED ORDER — OXYCODONE-ACETAMINOPHEN 5-325 MG PO TABS
1.0000 | ORAL_TABLET | Freq: Once | ORAL | Status: AC
  Administered 2012-02-19: 1 via ORAL
  Filled 2012-02-19: qty 1

## 2012-02-19 NOTE — ED Notes (Addendum)
Pt has had pain in neck and left shoulder blade intermittent for one year.  Shooting pain up back of neck and into left shoulder radiates to left shoulder blade.  Pt has taken pain meds and muscle relaxer and no relief.  Pt staes headache is worst headache of her life and feels like electricity starting in base of her head.  Pt is [redacted] weeks pregnant

## 2012-02-19 NOTE — ED Notes (Signed)
She is 6 weeks preg

## 2012-02-19 NOTE — ED Provider Notes (Signed)
History   This chart was scribed for No att. providers found by Clarita Crane. The patient was seen in room STRE4/STRE4. Patient's care was started at 1519.    CSN: [id number]  Arrival date & time 02/19/12  1519   None     Chief Complaint  Patient presents with  . neck pain     (Consider location/radiation/quality/duration/timing/severity/associated sxs/prior treatment) HPI Kathryn Goodwin is a 31 y.o. female who presents to the Emergency Department complaining of intermittent neck pain radiating to posterior left shoulder onset 2 years ago but worse the past 3 days with associated HA. Notes she will experience episodes of similar pain every several months with pain lasting several days at a time. States pain is aggravated with movement. Notes pain is not relieved with Ibuprofen and Tylenol. Denies numbness, tingling, fever, chills, nausea, vomiting, sore throat.   Past Medical History  Diagnosis Date  . No pertinent past medical history   . Depression     Past Surgical History  Procedure Date  . Cesarean section   . Salpingectomy     left  . Salpingectomy 2007    left    No family history on file.  History  Substance Use Topics  . Smoking status: Current Everyday Smoker -- 0.2 packs/day  . Smokeless tobacco: Not on file  . Alcohol Use: Yes    OB History    Grav Para Term Preterm Abortions TAB SAB Ect Mult Living   7 3   3  2 1  3       Review of Systems  Constitutional: Negative for fever and chills.  HENT: Positive for neck pain. Negative for sore throat.   Gastrointestinal: Negative for nausea and vomiting.  Musculoskeletal:       Left posterior shoulder pain.     Allergies  Review of patient's allergies indicates no known allergies.  Home Medications   Current Outpatient Rx  Name Route Sig Dispense Refill  . ACETAMINOPHEN 325 MG PO TABS Oral Take 650 mg by mouth every 6 (six) hours as needed. For pain    . DIAZEPAM 10 MG PO TABS Oral Take 10 mg  by mouth every 6 (six) hours as needed. As needed for anxiety    . ADULT MULTIVITAMIN W/MINERALS CH Oral Take 1 tablet by mouth daily.    Marland Kitchen PRENATAL MULTIVITAMIN CH Oral Take 1 tablet by mouth daily.    Marland Kitchen RANITIDINE HCL 150 MG PO TABS Oral Take 150 mg by mouth 2 (two) times daily.    Marland Kitchen HYDROCODONE-ACETAMINOPHEN 5-500 MG PO TABS Oral Take 1 tablet by mouth every 6 (six) hours as needed for pain. 8 tablet 0  . IBUPROFEN 600 MG PO TABS Oral Take 1 tablet (600 mg total) by mouth every 8 (eight) hours as needed for pain. 15 tablet 0    BP 116/67  Pulse 91  Temp(Src) 97.4 F (36.3 C) (Oral)  Resp 16  SpO2 99%  LMP 12/28/2011  Physical Exam  Nursing note and vitals reviewed. Constitutional: She is oriented to person, place, and time. She appears well-developed and well-nourished. No distress.  HENT:  Head: Normocephalic and atraumatic.  Eyes: EOM are normal. Pupils are equal, round, and reactive to light.  Neck: Neck supple. No tracheal deviation present.  Cardiovascular: Normal rate.   Pulmonary/Chest: Effort normal. No respiratory distress.  Abdominal: Soft. She exhibits no distension.  Musculoskeletal: Normal range of motion. She exhibits no edema.       Tenderness left trapezius.  No c-spine tenderness and no stepoffs noted.   Neurological: She is alert and oriented to person, place, and time. No sensory deficit.       Upper extremity strength normal and equal bilaterally.   Skin: Skin is warm and dry.  Psychiatric: She has a normal mood and affect. Her behavior is normal.    ED Course  Procedures (including critical care time)  DIAGNOSTIC STUDIES: Oxygen Saturation is 99% on room air, normal by my interpretation.    COORDINATION OF CARE:    Labs Reviewed - No data to display    1. Muscle spasms of neck       MDM  pts pain is musculoskeletal and likely represents trapezial spasm. nml upper extremity neuro exam      I personally performed the services described  in this documentation, which was scribed in my presence. The recorded information has been reviewed and considered.      Lyanne Co, MD 02/19/12 [phone]

## 2012-02-19 NOTE — ED Notes (Signed)
The pt has had neck and shoulder pain for 2 years.  Every so often she has flare-ups of this pain with no injury and she has had this pain again for 2 days.  The pain radiates from her neck down into her lt shoulder blade

## 2012-02-21 ENCOUNTER — Inpatient Hospital Stay (HOSPITAL_COMMUNITY)
Admission: AD | Admit: 2012-02-21 | Discharge: 2012-02-21 | Discharge status: Against Medical Advice | Payer: Self-pay | Source: Ambulatory Visit | Attending: Obstetrics & Gynecology | Admitting: Obstetrics & Gynecology

## 2012-02-21 NOTE — MAU Note (Signed)
Not in lobby when called third time

## 2012-02-21 NOTE — MAU Note (Signed)
Pt called, not in lobby 

## 2012-02-21 NOTE — MAU Note (Signed)
Pt called, not in lobby at 1543

## 2014-08-30 ENCOUNTER — Encounter (HOSPITAL_COMMUNITY): Payer: Self-pay | Admitting: *Deleted

## 2018-11-18 ENCOUNTER — Other Ambulatory Visit: Payer: Self-pay

## 2018-11-18 ENCOUNTER — Encounter (HOSPITAL_COMMUNITY): Payer: Self-pay | Admitting: Emergency Medicine

## 2018-11-18 ENCOUNTER — Emergency Department (HOSPITAL_COMMUNITY)
Admission: EM | Admit: 2018-11-18 | Discharge: 2018-11-18 | Disposition: A | Discharge status: Home / Self Care | Payer: Medicaid - Out of State | Attending: Emergency Medicine | Admitting: Emergency Medicine

## 2018-11-18 DIAGNOSIS — T403X5A Adverse effect of methadone, initial encounter: Secondary | ICD-10-CM

## 2018-11-18 DIAGNOSIS — Z79899 Other long term (current) drug therapy: Secondary | ICD-10-CM | POA: Diagnosis not present

## 2018-11-18 DIAGNOSIS — F1120 Opioid dependence, uncomplicated: Secondary | ICD-10-CM | POA: Diagnosis present

## 2018-11-18 DIAGNOSIS — F17210 Nicotine dependence, cigarettes, uncomplicated: Secondary | ICD-10-CM | POA: Insufficient documentation

## 2018-11-18 NOTE — ED Triage Notes (Signed)
Pt reports that she is visiting here from [place].  States " My Dr. At my clinic told me to come here. Pt reports her last dose of methadone was yesterday around 0830. Reports headache.

## 2018-11-18 NOTE — Discharge Instructions (Addendum)
You have been seen today for methadone use. Please read and follow all provided instructions.   1. Medications: usual home medications 2. Treatment: rest, drink plenty of fluids 3. Follow Up: Please follow up with your primary doctor in 1 day for discussion of your diagnoses and further evaluation after today's visit; if you do not have a primary care doctor use the resource guide provided to find one; Please return to the ER for any new or worsening symptoms. Please obtain all of your results from medical records or have your doctors office obtain the results - share them with your doctor - you should be seen at your doctors office. Call today to arrange your follow up.   Take medications as prescribed. Please review all of the medicines and only take them if you do not have an allergy to them. Return to the emergency room for worsening condition or new concerning symptoms. Follow up with your regular doctor. If you don't have a regular doctor use one of the numbers below to establish a primary care doctor.  Please be aware that if you are taking birth control pills, taking other prescriptions, ESPECIALLY ANTIBIOTICS may make the birth control ineffective - if this is the case, either do not engage in sexual activity or use alternative methods of birth control such as condoms until you have finished the medicine and your family doctor says it is OK to restart them. If you are on a blood thinner such as COUMADIN, be aware that any other medicine that you take may cause the coumadin to either work too much, or not enough - you should have your coumadin level rechecked in next 7 days if this is the case.  ?  It is also a possibility that you have an allergic reaction to any of the medicines that you have been prescribed - Everybody reacts differently to medications and while MOST people have no trouble with most medicines, you may have a reaction such as nausea, vomiting, rash, swelling, shortness of breath.  If this is the case, please stop taking the medicine immediately and contact your physician.  ?  You should return to the ER if you develop severe or worsening symptoms.   Emergency Department Resource Guide 1) Find a Doctor and Pay Out of Pocket Although you won't have to find out who is covered by your insurance plan, it is a good idea to ask around and get recommendations. You will then need to call the office and see if the doctor you have chosen will accept you as a new patient and what types of options they offer for patients who are self-pay. Some doctors offer discounts or will set up payment plans for their patients who do not have insurance, but you will need to ask so you aren't surprised when you get to your appointment.  2) Contact Your Local Health Department Not all health departments have doctors that can see patients for sick visits, but many do, so it is worth a call to see if yours does. If you don't know where your local health department is, you can check in your phone book. The CDC also has a tool to help you locate your state's health department, and many state websites also have listings of all of their local health departments.  3) Find a Walk-in Clinic If your illness is not likely to be very severe or complicated, you may want to try a walk in clinic. These are popping up all over  the country in pharmacies, drugstores, and shopping centers. They're usually staffed by nurse practitioners or physician assistants that have been trained to treat common illnesses and complaints. They're usually fairly quick and inexpensive. However, if you have serious medical issues or chronic medical problems, these are probably not your best option.  No Primary Care Doctor: Call Health Connect at  [phone] - they can help you locate a primary care doctor that  accepts your insurance, provides certain services, etc. Physician Referral [phone]  Emergency Department Resource  Guide 1) Find a Doctor and Pay Out of Pocket Although you won't have to find out who is covered by your insurance plan, it is a good idea to ask around and get recommendations. You will then need to call the office and see if the doctor you have chosen will accept you as a new patient and what types of options they offer for patients who are self-pay. Some doctors offer discounts or will set up payment plans for their patients who do not have insurance, but you will need to ask so you aren't surprised when you get to your appointment.  2) Contact Your Local Health Department Not all health departments have doctors that can see patients for sick visits, but many do, so it is worth a call to see if yours does. If you don't know where your local health department is, you can check in your phone book. The CDC also has a tool to help you locate your state's health department, and many state websites also have listings of all of their local health departments.  3) Find a [facility] If your illness is not likely to be very severe or complicated, you may want to try a walk in clinic. These are popping up all over the country in pharmacies, drugstores, and shopping centers. They're usually staffed by nurse practitioners or physician assistants that have been trained to treat common illnesses and complaints. They're usually fairly quick and inexpensive. However, if you have serious medical issues or chronic medical problems, these are probably not your best option.  No Primary Care Doctor: Call Health Connect at  [phone] - they can help you locate a primary care doctor that  accepts your insurance, provides certain services, etc. Physician Referral Service- [phone]  Chronic Pain Problems: Organization         Address  Phone   Notes  [facility]  [phone] Patients need to be referred by their primary care doctor.   Medication Assistance: Organization          Address  Phone   Notes  [facility] Medication [facility] [address] [place], [place] [address] [phone] --Must be a resident of [facility] -- Must have NO insurance coverage whatsoever (no Medicaid/ Medicare, etc.) -- The pt. MUST have a primary care doctor that directs their care regularly and follows them in the community   [facility]  [phone]   [employer]  [phone]    Agencies that provide inexpensive medical care: Organization         Address  Phone   Notes  [place]  [phone]   [facility]    [phone]   [facility] [place], [place] [address] [phone]   [place] [address] [place] [phone]   [facility]    [phone]   [facility]    [phone]   °[facility] ° [address], [place] Phone:  [phone], Fax:  [phone] Hours of Operation:  9 am - 6 pm, M-F.  Also accepts Medicaid/Medicare and self-pay.  °[facility] ° [address], [place] Phone: [phone], Fax: [phone]. Hours of Operation:  8:30 am - 5:30 pm, M-F.  Also accepts Medicaid and self-pay.  °[facility] [place] [address], [place] Phone: [phone]   °[facility] [address], [place], [place] [phone], Ext. [phone] Mondays & Thursdays: 7-9 AM.  First 15 patients are seen on a first come, first serve basis. °  ° °Medicaid-accepting [place] Providers: ° °Organization         Address  Phone   Notes  °[facility] [address], [place] [phone] Also accepts self-pay patients.  °[facility] [address], [place] ° [phone]   °[facility] [address], [place] [phone]   °[facility] [address] [place] [address], [place] [phone]   °[facility] [address], [place]  ° [phone] Only accepts [place] Access Medicaid patients after they have their name applied to their card.  ° °Self-Pay (no insurance) in [place]: ° °Organization         Address  Phone   Notes  °Sickle Cell Patients, [facility] [address], [place] [phone]   °[facility] [address], [place] [phone]   °[facility] ° [address],  [phone]   °[facility]/Dr. Osei-Bonsu ° [address] [place] [address], [place] or [address], [place] [phone] Phone number for both [place] and [place] locations is the same.  °[facility] [address], [place] [phone]   °[facility]  [address] [place] [address],  or [address] [phone] °([phone]) [phone]   °[facility] [address],  [phone], phone; [phone], fax Sees patients 1st and 3rd Saturday of every month.  Must not qualify for public or private insurance (i.e. Medicaid, Medicare, [place] Health Choice, Veterans' Benefits)  Household income should be no more than 200% of the poverty level The clinic cannot treat you if you are pregnant or think you are pregnant  Sexually transmitted diseases are not treated at the clinic.  °  ° °

## 2018-11-18 NOTE — ED Notes (Signed)
Pt not found in room for discharge instructions or vital signs.

## 2018-11-18 NOTE — ED Provider Notes (Signed)
[facility] Provider Note   CSN: [id number] Arrival date & time: 11/18/18  1243     History   Chief Complaint Chief Complaint  Patient presents with  . Detox    HPI Kathryn Goodwin is a 38 y.o. female with a PMH of opiate abuse and depression presents requesting methadone. Patient states she [place] in [place] and is here visiting family since last night. Patient states she visits the methadone clinic daily and states she receives 100mg  once/day. Patient states last dose of methadone was yesterday at 07:30am. Patient states she attempted to go to a methadone clinic today, but the clinic was closed. Patient reports she contacted her doctor and was advised to come to the ER. Patient is unable to remember physician's name. Patient reports she had a throbbing frontal headache, rhinorrhea, chills, and sweats. Patient denies cough, chest pain, or shortness of breath. Patient states headache is similar to headaches she has had in the past and denies nausea, vomiting, vision changes, weakness, neck pain, or numbness. Patient denies any current IVDU or alcohol use. Patient states she has been on methadone for 9 months.   HPI  Past Medical History:  Diagnosis Date  . Depression   . No pertinent past medical history     Patient Active Problem List   Diagnosis Date Noted  . Depression     Past Surgical History:  Procedure Laterality Date  . CESAREAN SECTION    . SALPINGECTOMY     left  . SALPINGECTOMY  2007   left     OB History    Gravida  7   Para  3   Term      Preterm      AB  3   Living  3     SAB  2   TAB      Ectopic  1   Multiple      Live Births               Home Medications    Prior to Admission medications   Medication Sig Start Date End Date Taking? Authorizing Provider  acetaminophen (TYLENOL) 325 MG tablet Take 650 mg by mouth every 6 (six) hours as needed. For pain    [provider]    diazepam (VALIUM) 10 MG tablet Take 10 mg by mouth every 6 (six) hours as needed. As needed for anxiety    [provider]  Multiple Vitamin (MULITIVITAMIN WITH MINERALS) TABS Take 1 tablet by mouth daily.    [provider]  Prenatal Vit-Fe Fumarate-FA (PRENATAL MULTIVITAMIN) TABS Take 1 tablet by mouth daily.    [provider]  ranitidine (ZANTAC) 150 MG tablet Take 150 mg by mouth 2 (two) times daily.    [provider]    Family History No family history on file.  Social History Social History   Tobacco Use  . Smoking status: Current Every Day Smoker    Packs/day: 0.25  . Smokeless tobacco: Never Used  Substance Use Topics  . Alcohol use: Yes  . Drug use: No     Allergies   Patient has no known allergies.   Review of Systems Review of Systems  Constitutional: Positive for chills and diaphoresis. Negative for activity change, appetite change, fatigue, fever and unexpected weight change.  HENT: Positive for rhinorrhea. Negative for congestion, ear pain, sinus pressure and sore throat.   Eyes: Negative for photophobia and visual disturbance.  Respiratory: Negative  for shortness of breath.   Cardiovascular: Negative for chest pain.  Gastrointestinal: Negative for abdominal pain, nausea and vomiting.  Musculoskeletal: Negative for gait problem, myalgias, neck pain and neck stiffness.  Allergic/Immunologic: Negative for immunocompromised state.  Neurological: Positive for headaches. Negative for dizziness, seizures, syncope, speech difficulty, weakness and numbness.  Hematological: Does not bruise/bleed easily.  Psychiatric/Behavioral: Negative for sleep disturbance. The patient is not nervous/anxious.      Physical Exam Updated Vital Signs BP 112/68 (BP Location: Right Arm)   Pulse 90   Temp 97.8 F (36.6 C) (Oral)   Resp 16   LMP 11/11/2018   SpO2 99%   Breastfeeding Unknown   Physical Exam Vitals signs and nursing note  reviewed.  Constitutional:      General: She is not in acute distress.    Appearance: She is well-developed. She is not diaphoretic.  HENT:     Head: Normocephalic and atraumatic.     Nose: Nose normal.     Mouth/Throat:     Mouth: Mucous membranes are moist.     Pharynx: No oropharyngeal exudate or posterior oropharyngeal erythema.  Eyes:     Extraocular Movements: Extraocular movements intact.     Conjunctiva/sclera: Conjunctivae normal.     Pupils: Pupils are equal, round, and reactive to light.  Neck:     Musculoskeletal: Normal range of motion and neck supple.  Cardiovascular:     Rate and Rhythm: Normal rate and regular rhythm.     Heart sounds: Normal heart sounds. No murmur. No friction rub. No gallop.   Pulmonary:     Effort: Pulmonary effort is normal. No respiratory distress.     Breath sounds: Normal breath sounds. No wheezing or rales.  Abdominal:     Palpations: Abdomen is soft.     Tenderness: There is no abdominal tenderness.  Musculoskeletal: Normal range of motion.  Skin:    Findings: No erythema or rash.  Neurological:     Mental Status: She is alert and oriented to person, place, and time.    Mental Status:  Alert, oriented, thought content appropriate, able to give a coherent history. Speech fluent without evidence of aphasia. Able to follow 2 step commands without difficulty.  Cranial Nerves:  II:  Peripheral visual fields grossly normal, pupils equal, round, reactive to light III,IV, VI: ptosis not present, extra-ocular motions intact bilaterally  V,VII: smile symmetric, facial light touch sensation equal VIII: hearing grossly normal to voice  X: uvula elevates symmetrically  XI: bilateral shoulder shrug symmetric and strong XII: midline tongue extension without fassiculations Motor:  Normal tone. 5/5 in upper and lower extremities bilaterally including strong and equal grip strength and dorsiflexion/plantar flexion Sensory: Pinprick and light touch  normal in all extremities.  Deep Tendon Reflexes: 2+ and symmetric in the biceps and patella Cerebellar: normal finger-to-nose with bilateral upper extremities Gait: normal gait and balance.  Negative pronator drift. Negative Romberg sign. CV: distal pulses palpable throughout    ED Treatments / Results  Labs (all labs ordered are listed, but only abnormal results are displayed) Labs Reviewed - No data to display  EKG None  Radiology No results found.  Procedures Procedures (including critical care time)  Medications Ordered in ED Medications - No data to display   Initial Impression / Assessment and Plan / ED Course  I have reviewed the triage vital signs and the nursing notes.  Pertinent labs & imaging results that were available during my care of the patient were reviewed  by me and considered in my medical decision making (see chart for details).  Clinical Course as of Nov 19 1427  Tue Nov 18, 2018  1415 Discussed with patient that we cannot provide Methadone. Offered to provide symptomatic treatment and patient refused. Offered to provide information about methadone clinics nearby. Patient refused. Patient is requesting to be discharged.    [AH]    Clinical Course User Index [AH] Leretha DykesHernandez, Elysa Womac P, PA-C   Suspect symptoms are likely due to the methadone withdrawal. Discussed with patient that we are unable to give her methadone, but informed her that we can provide symptomatic treatment. Patient refused symptomatic treatment. Discussed consulting social work for information on methadone clinics in the area. Patient refused additional information. Advised patient to follow up with PCP and methadone clinic in 1 day.   Findings and plan of care discussed with supervising physician Dr. Madilyn Hookees.  Final Clinical Impressions(s) / ED Diagnoses   Final diagnoses:  Adverse effect of methadone, initial encounter    ED Discharge Orders    None       Leretha DykesHernandez, [place] Wheat P,  [place]-C 11/18/18 1430    Tilden Fossaees, Elizabeth, MD 11/18/18 [phone]

## 2019-11-03 ENCOUNTER — Emergency Department (HOSPITAL_COMMUNITY): Payer: Self-pay

## 2019-11-03 ENCOUNTER — Other Ambulatory Visit: Payer: Self-pay

## 2019-11-03 ENCOUNTER — Emergency Department (HOSPITAL_COMMUNITY)
Admission: EM | Admit: 2019-11-03 | Discharge: 2019-11-04 | Disposition: A | Discharge status: Home / Self Care | Payer: Self-pay | Attending: Emergency Medicine | Admitting: Emergency Medicine

## 2019-11-03 ENCOUNTER — Encounter (HOSPITAL_COMMUNITY): Payer: Self-pay | Admitting: *Deleted

## 2019-11-03 DIAGNOSIS — F17210 Nicotine dependence, cigarettes, uncomplicated: Secondary | ICD-10-CM | POA: Insufficient documentation

## 2019-11-03 DIAGNOSIS — Y929 Unspecified place or not applicable: Secondary | ICD-10-CM | POA: Insufficient documentation

## 2019-11-03 DIAGNOSIS — Y939 Activity, unspecified: Secondary | ICD-10-CM | POA: Insufficient documentation

## 2019-11-03 DIAGNOSIS — Y999 Unspecified external cause status: Secondary | ICD-10-CM | POA: Insufficient documentation

## 2019-11-03 DIAGNOSIS — R6884 Jaw pain: Secondary | ICD-10-CM | POA: Insufficient documentation

## 2019-11-03 DIAGNOSIS — S0511XA Contusion of eyeball and orbital tissues, right eye, initial encounter: Secondary | ICD-10-CM | POA: Insufficient documentation

## 2019-11-03 DIAGNOSIS — T7421XA Adult sexual abuse, confirmed, initial encounter: Secondary | ICD-10-CM | POA: Insufficient documentation

## 2019-11-03 DIAGNOSIS — R519 Headache, unspecified: Secondary | ICD-10-CM | POA: Insufficient documentation

## 2019-11-03 DIAGNOSIS — K08419 Partial loss of teeth due to trauma, unspecified class: Secondary | ICD-10-CM | POA: Insufficient documentation

## 2019-11-03 DIAGNOSIS — Z23 Encounter for immunization: Secondary | ICD-10-CM | POA: Insufficient documentation

## 2019-11-03 DIAGNOSIS — S40212A Abrasion of left shoulder, initial encounter: Secondary | ICD-10-CM | POA: Insufficient documentation

## 2019-11-03 LAB — PREGNANCY, URINE: Preg Test, Ur: NEGATIVE

## 2019-11-03 MED ORDER — NAPROXEN 500 MG PO TABS: 500.0000 mg | ORAL_TABLET | Freq: Two times a day (BID) | ORAL | 0 refills | Status: AC

## 2019-11-03 MED ORDER — OXYCODONE-ACETAMINOPHEN 5-325 MG PO TABS
1.0000 | ORAL_TABLET | Freq: Once | ORAL | Status: AC
  Administered 2019-11-03: 17:00:00 1 via ORAL
  Filled 2019-11-03: qty 1

## 2019-11-03 MED ORDER — STERILE WATER FOR INJECTION IJ SOLN
INTRAMUSCULAR | Status: AC
  Administered 2019-11-03: 20:00:00 2.1 mL
  Filled 2019-11-03: qty 10

## 2019-11-03 MED ORDER — AZITHROMYCIN 250 MG PO TABS
1000.0000 mg | ORAL_TABLET | Freq: Once | ORAL | Status: AC
  Administered 2019-11-03: 1000 mg via ORAL
  Filled 2019-11-03: qty 4

## 2019-11-03 MED ORDER — CEFTRIAXONE SODIUM 250 MG IJ SOLR
250.0000 mg | Freq: Once | INTRAMUSCULAR | Status: AC
  Administered 2019-11-03: 250 mg via INTRAMUSCULAR
  Filled 2019-11-03: qty 250

## 2019-11-03 MED ORDER — LIDOCAINE HCL (PF) 1 % IJ SOLN
0.9000 mL | Freq: Once | INTRAMUSCULAR | Status: DC
  Filled 2019-11-03: qty 30

## 2019-11-03 MED ORDER — HYDROCODONE-ACETAMINOPHEN 5-325 MG PO TABS: 1.0000 | ORAL_TABLET | Freq: Four times a day (QID) | ORAL | 0 refills | Status: AC | PRN

## 2019-11-03 MED ORDER — METHOCARBAMOL 500 MG PO TABS: 500.0000 mg | ORAL_TABLET | Freq: Two times a day (BID) | ORAL | 0 refills | Status: AC

## 2019-11-03 MED ORDER — TETANUS-DIPHTH-ACELL PERTUSSIS 5-2.5-18.5 LF-MCG/0.5 IM SUSP
0.5000 mL | Freq: Once | INTRAMUSCULAR | Status: AC
  Administered 2019-11-03: 17:00:00 0.5 mL via INTRAMUSCULAR
  Filled 2019-11-03: qty 0.5

## 2019-11-03 MED ORDER — NICOTINE 21 MG/24HR TD PT24
21.0000 mg | MEDICATED_PATCH | Freq: Once | TRANSDERMAL | Status: DC | PRN
  Administered 2019-11-04: 21 mg via TRANSDERMAL
  Filled 2019-11-03: qty 1

## 2019-11-03 MED ORDER — IBUPROFEN 200 MG PO TABS
600.0000 mg | ORAL_TABLET | Freq: Four times a day (QID) | ORAL | Status: DC | PRN
  Administered 2019-11-04: 600 mg via ORAL
  Filled 2019-11-03: qty 3

## 2019-11-03 MED ORDER — METRONIDAZOLE 500 MG PO TABS
2000.0000 mg | ORAL_TABLET | Freq: Once | ORAL | Status: AC
  Administered 2019-11-03: 20:00:00 2000 mg via ORAL
  Filled 2019-11-03: qty 4
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
## 2019-11-03 NOTE — Discharge Instructions (Addendum)
As discussed, all of your images were negative for bony fractures and acute abnormalities. I am sending you home with 2 pain medications. Save hydrocodone for severe pain and use Naproxen for mild and moderate pain. Take as prescribed. I am also sending you home with a muscle relaxer called Robaxin. Both the muscle relaxer and hydrocodone can cause drowsiness, so do not drive or operate machinery while on the medication. Follow-up with your PCP if your symptoms do not improve within the next week. Return to the ER for new or worsening symptoms.   I have included the number of an oral surgeon. You may call tomorrow and schedule an appointment for further evaluation of your tooth.

## 2019-11-03 NOTE — Progress Notes (Addendum)
EDCSW spoke via phone to Kathryn Goodwin at [phone] of [facility]. [name] informed CSW that there are beds availabe at [facility] in a socially distanced forum.   CSW spoke via phone to Pt and informed her that shelter would be available after 8am.  Pt stated that she has no family and no one to call in order to get help.  Pt also stated that she has no cell phone.  CSW informed PT that CSW would include number for Kathryn Goodwin in summary so that it will be available at discharge.  CSW also informed PT that CSW will include food resources.  CSW updated Kathryn Morn, NP concerning unable to find secure bed tonight, will have daytime CSW at [facility] follow up tomorrow.  CSW also updated the charge nurse as to the status of PT.  Kathryn Goodwin MSW LCSWA Transitions of Care  Clinical Social Worker  [facility] Emergency Departments  [phone]
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
## 2019-11-03 NOTE — SANE Note (Signed)
1653: Returned call to [facility] ED and was informed of patient and her situation by [place], [place]. I advised I was finishing another case and would be over as soon as I could make it. I also stated I would put a call out to forensic nurse team for availability to come see patient.
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
## 2019-11-04 NOTE — ED Notes (Signed)
tp given a sandwich and something to drink

## 2019-11-04 NOTE — ED Notes (Signed)
Patient states she has a bus ticket back to [place] and needs to get to the bus station. Kathryn Goodwin to page SW.

## 2019-11-04 NOTE — ED Notes (Signed)
When this RN arrived for shift pt not present in room. Triage nurse stated pt must have left.
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
## 2020-12-27 IMAGING — CR DG SHOULDER 2+V*L*
3 series · 3 of 3 positions shown · non-contrast
Comparison: Chest x-ray 11/28/2011.

CLINICAL DATA: Left shoulder pain.

EXAM:
LEFT SHOULDER - 2+ VIEW

[x shoulder ap left (1 of 3)]
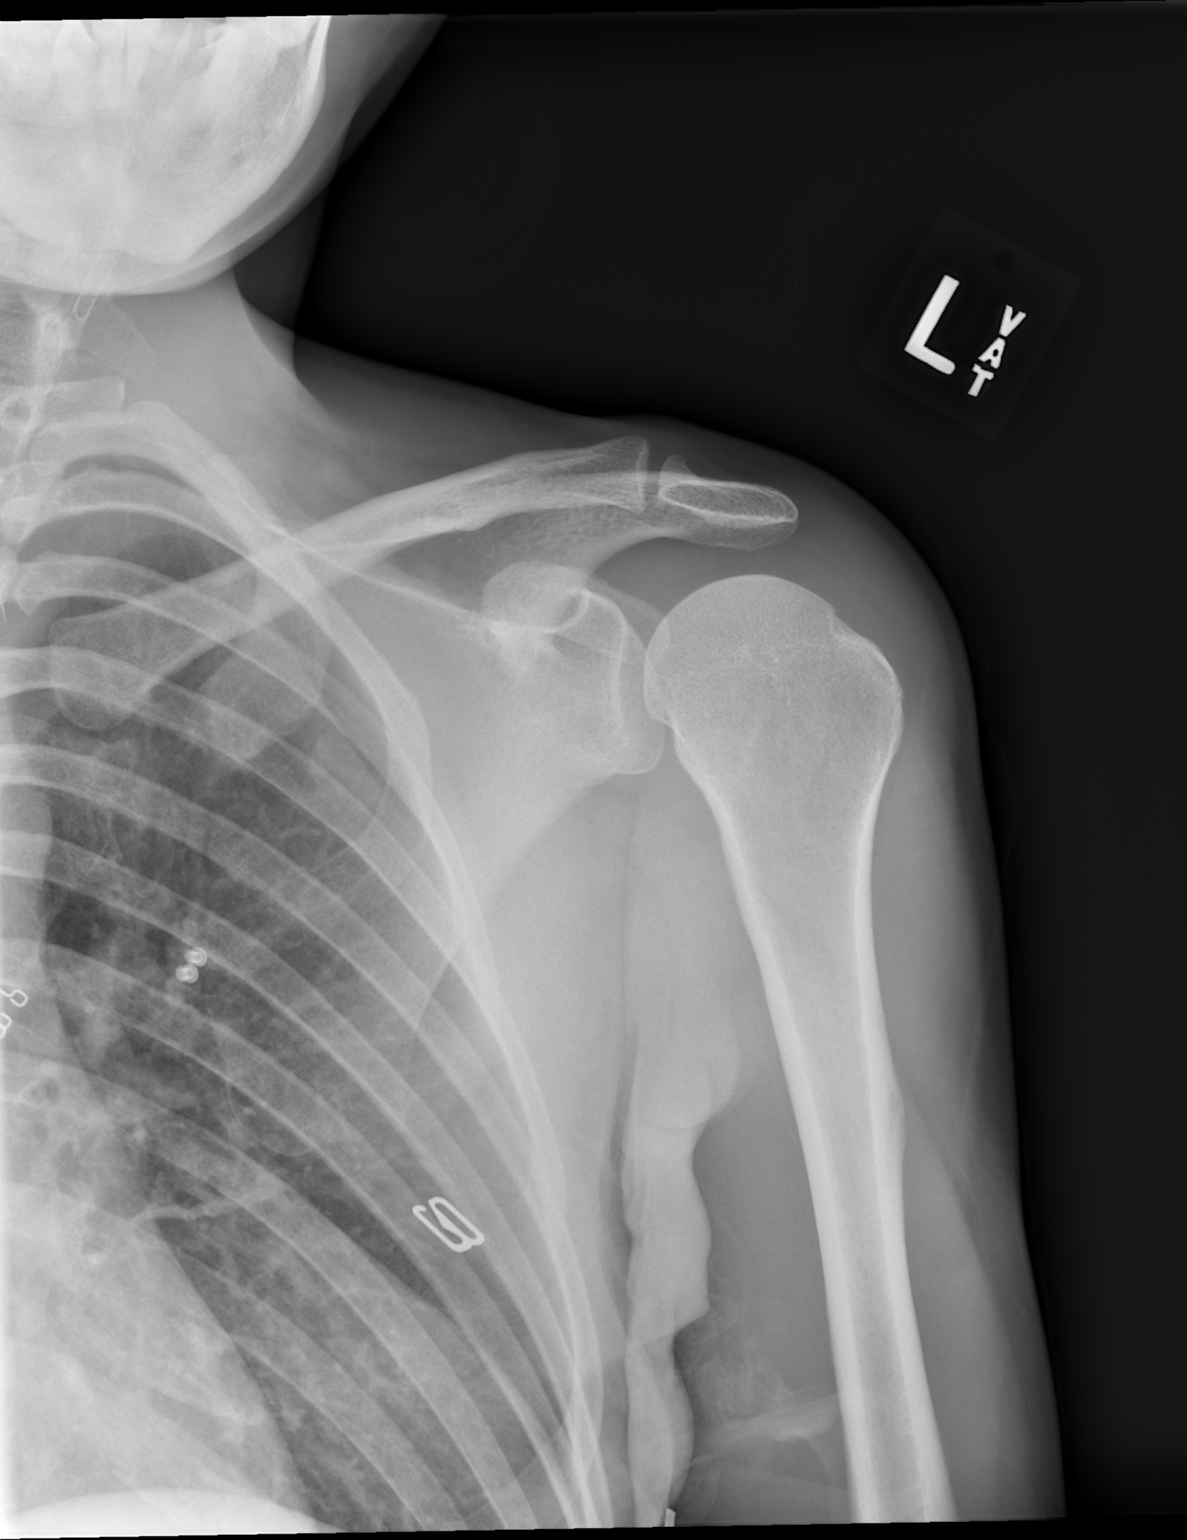

[x shoulder ap left (2 of 3)]
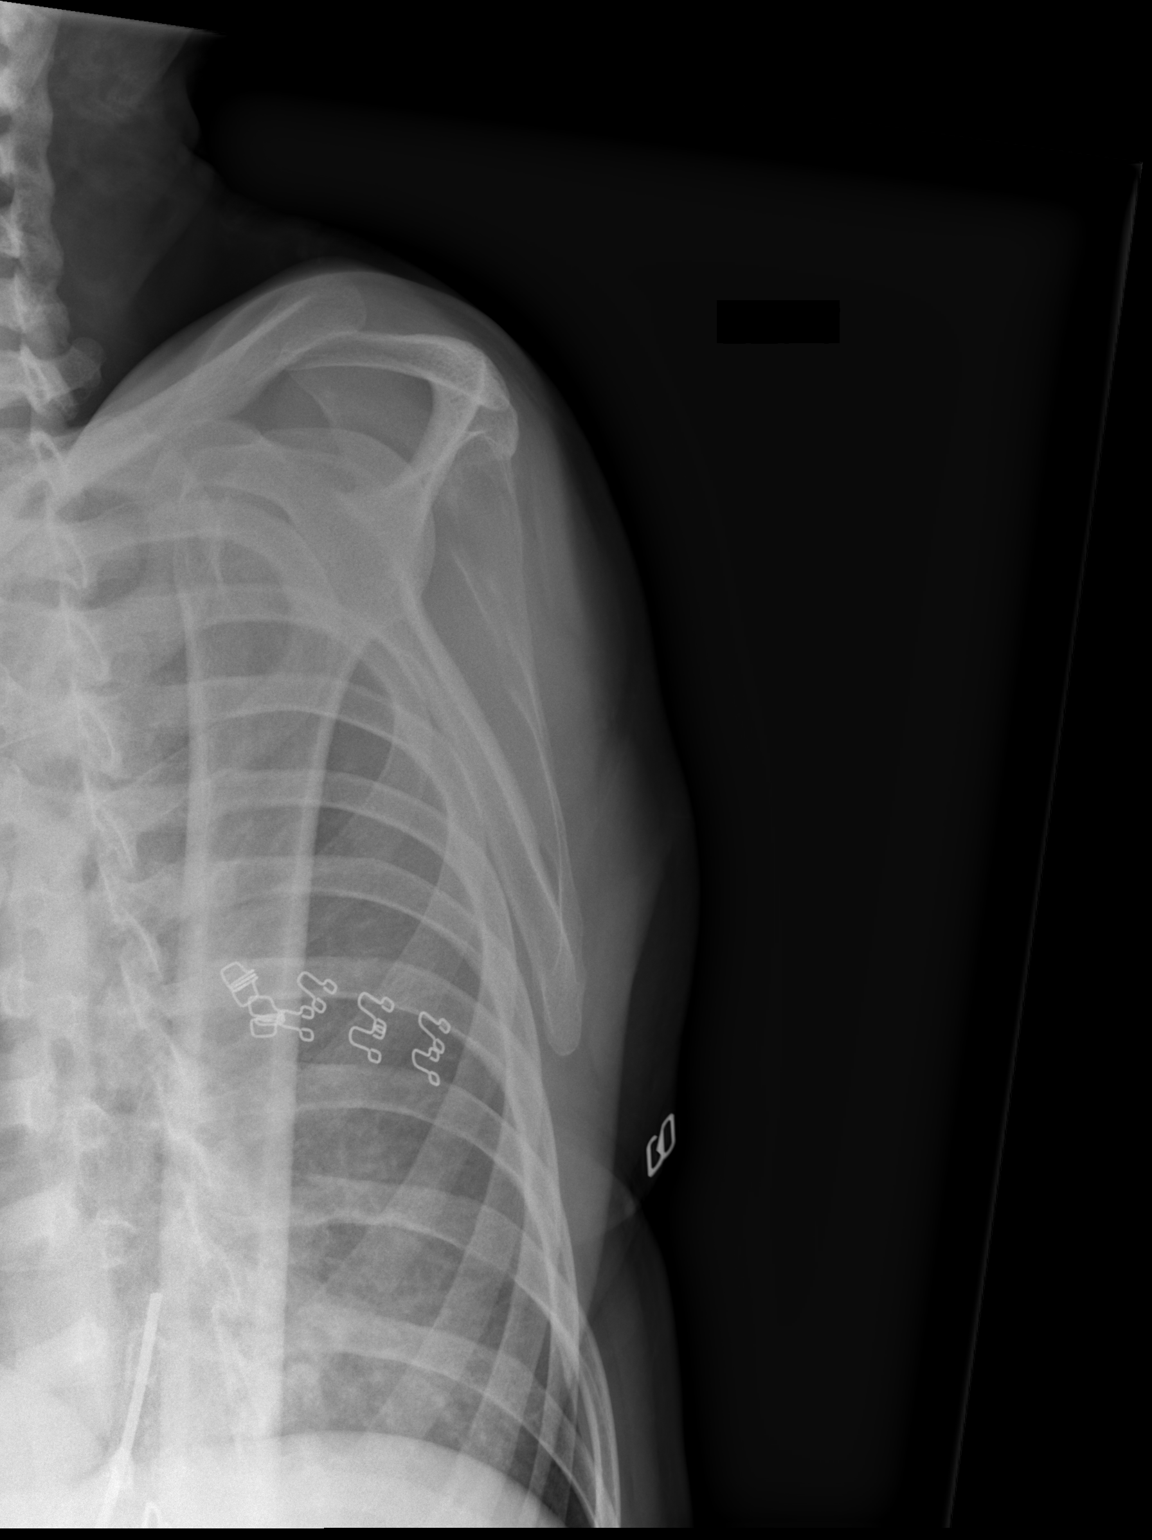

[x shoulder ap left (3 of 3)]
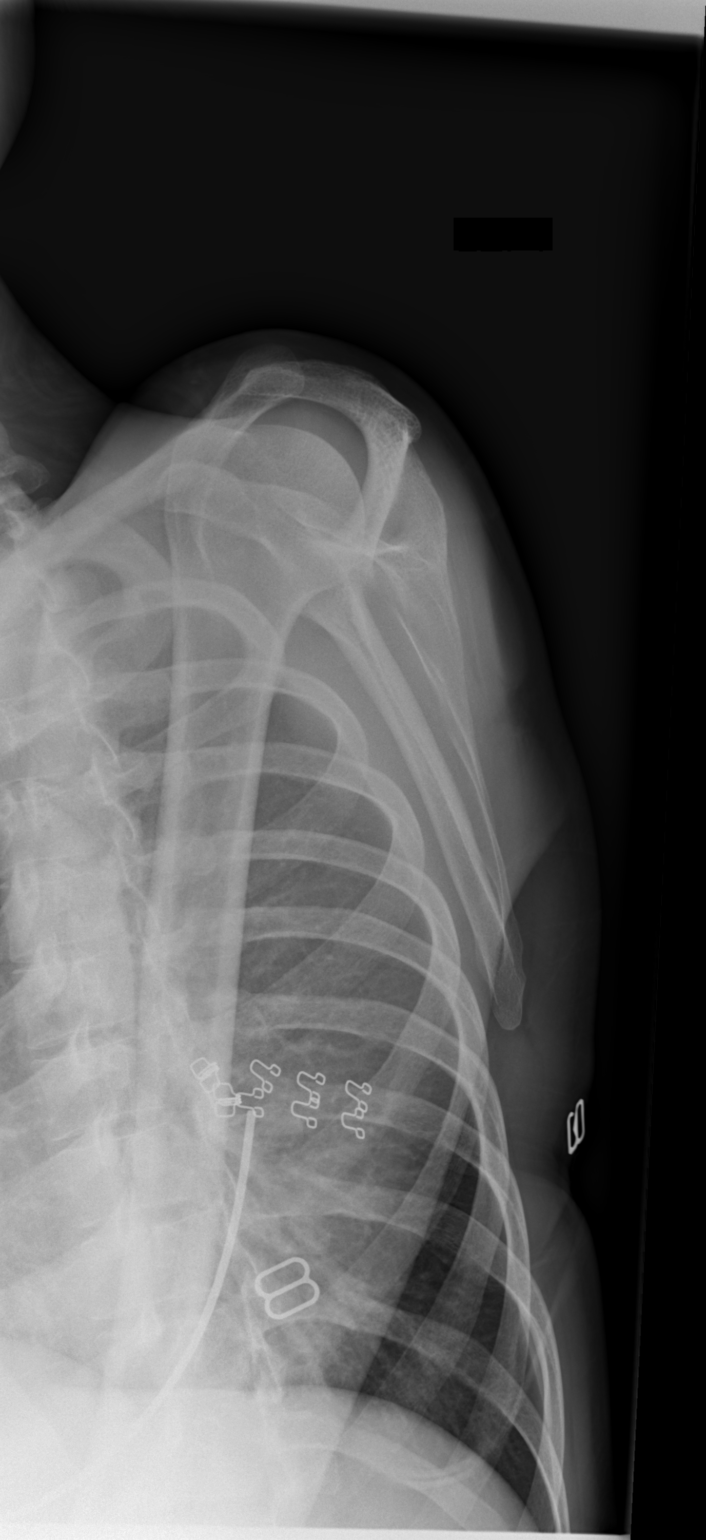

[3 of 3 positions shown; findings below may reference images not displayed]

FINDINGS: No acute bony or joint abnormality identified. No evidence of
fracture or dislocation. No evidence of separation. Mild
acromioclavicular degenerative change.
IMPRESSION: No acute abnormality identified. Mild acromioclavicular degenerative
change.
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
